# Patient Record
Sex: Female | Born: 1995 | Race: Black or African American | Hispanic: No | Marital: Single | Smoking: Current every day smoker
Health system: Southern US, Community
[De-identification: ages and names within clinical notes are randomized; demographics above are authoritative.]

## PROBLEM LIST (undated history)

## (undated) DIAGNOSIS — I10 Essential (primary) hypertension: Secondary | ICD-10-CM

## (undated) DIAGNOSIS — F99 Mental disorder, not otherwise specified: Secondary | ICD-10-CM

## (undated) DIAGNOSIS — K219 Gastro-esophageal reflux disease without esophagitis: Secondary | ICD-10-CM

## (undated) DIAGNOSIS — D649 Anemia, unspecified: Secondary | ICD-10-CM

## (undated) DIAGNOSIS — F32A Depression, unspecified: Secondary | ICD-10-CM

## (undated) DIAGNOSIS — Z789 Other specified health status: Secondary | ICD-10-CM

## (undated) DIAGNOSIS — B951 Streptococcus, group B, as the cause of diseases classified elsewhere: Secondary | ICD-10-CM

## (undated) DIAGNOSIS — F419 Anxiety disorder, unspecified: Secondary | ICD-10-CM

## (undated) DIAGNOSIS — T7840XA Allergy, unspecified, initial encounter: Secondary | ICD-10-CM

## (undated) HISTORY — DX: Mental disorder, not otherwise specified: F99

## (undated) HISTORY — DX: Depression, unspecified: F32.A

## (undated) HISTORY — DX: Gastro-esophageal reflux disease without esophagitis: K21.9

## (undated) HISTORY — DX: Essential (primary) hypertension: I10

## (undated) HISTORY — DX: Anemia, unspecified: D64.9

## (undated) HISTORY — DX: Allergy, unspecified, initial encounter: T78.40XA

## (undated) HISTORY — DX: Anxiety disorder, unspecified: F41.9

## (undated) HISTORY — PX: NO PAST SURGERIES: SHX2092

---
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
## 2014-08-18 ENCOUNTER — Emergency Department: Payer: Self-pay | Admitting: Emergency Medicine

## 2015-09-27 DIAGNOSIS — Z7251 High risk heterosexual behavior: Secondary | ICD-10-CM | POA: Diagnosis not present

## 2015-09-27 DIAGNOSIS — R112 Nausea with vomiting, unspecified: Secondary | ICD-10-CM | POA: Diagnosis not present

## 2015-09-27 DIAGNOSIS — Z113 Encounter for screening for infections with a predominantly sexual mode of transmission: Secondary | ICD-10-CM | POA: Diagnosis not present

## 2015-09-27 DIAGNOSIS — R634 Abnormal weight loss: Secondary | ICD-10-CM | POA: Diagnosis not present

## 2015-10-20 DIAGNOSIS — D509 Iron deficiency anemia, unspecified: Secondary | ICD-10-CM | POA: Diagnosis not present

## 2015-10-20 DIAGNOSIS — K219 Gastro-esophageal reflux disease without esophagitis: Secondary | ICD-10-CM | POA: Diagnosis not present

## 2015-10-20 DIAGNOSIS — R1084 Generalized abdominal pain: Secondary | ICD-10-CM | POA: Diagnosis not present

## 2015-10-20 DIAGNOSIS — R634 Abnormal weight loss: Secondary | ICD-10-CM | POA: Diagnosis not present

## 2016-05-09 LAB — HM PAP SMEAR: HM Pap smear: NEGATIVE

## 2016-09-28 ENCOUNTER — Emergency Department
Admission: EM | Admit: 2016-09-28 | Discharge: 2016-09-28 | Disposition: A | Discharge status: Home / Self Care | Payer: BLUE CROSS/BLUE SHIELD | Attending: Emergency Medicine | Admitting: Emergency Medicine

## 2016-09-28 DIAGNOSIS — J029 Acute pharyngitis, unspecified: Secondary | ICD-10-CM | POA: Diagnosis present

## 2016-09-28 DIAGNOSIS — B349 Viral infection, unspecified: Secondary | ICD-10-CM | POA: Diagnosis not present

## 2016-09-28 NOTE — Discharge Instructions (Signed)
You have been seen in the Emergency Department (ED) today for a likely viral illness.  Please drink plenty of clear fluids (water, Gatorade, chicken broth, etc).  You may use Tylenol and/or Motrin according to label instructions.  You can alternate between the two without any side effects.   As we discussed, it is POSSIBLE (though not certain) that you may have mononucleosis.  You declined testing tonight, but please read through the included information.  Just to be safe, we recommend you avoid contact sports or other circumstances where you may sustain an injury to your abdomen, because mononucleosis can cause swelling of your spleen which can rupture if you receive a traumatic injury to your abdomen.  Please follow up with your doctor as listed above.  Call your doctor or return to the Emergency Department (ED) if you are unable to tolerate fluids due to vomiting, have worsening trouble breathing, become extremely tired or difficult to awaken, or if you develop any other symptoms that concern you.

## 2016-09-28 NOTE — ED Provider Notes (Signed)
[facility] Emergency Department Provider Note  ____________________________________________   First MD Initiated Contact with Patient 09/28/16 0411     (approximate)  I have reviewed the triage vital signs and the nursing notes.   HISTORY  Chief Complaint Generalized Body Aches    HPI Katrina Ortega is a 21 y.o. female who is generally a healthy college student who is up-to-date on her vaccinations who presents by private vehicle in the company of her mother for evaluation of gradual onset generalized body aches, sore throat, and swollen lymph nodes on her neck as well as mild headache over the last 3 days.  She states it started as a sore throat which is persistent.  The rest of the symptoms developed gradually.  She denies fever/chills, chest pain, shortness of breath, cough, nausea, vomiting, abdominal pain, dysuria.  She does not have any difficulty swallowing or speaking but states that the sore throat is moderate.  She has some mild pain when she turns her head from side to side that she thinks is the result of the "swollen lumps" on her neck.  She has been taking tea that is supposed to be for the flu which includes acetaminophen but she has not taken any ibuprofen.  The tea makes her little bit better, nothing in particular makes her feel worse.   No past medical history on file.  There are no active problems to display for this patient.   No past surgical history on file.  Prior to Admission medications   Not on File    Allergies Percocet [oxycodone-acetaminophen]  No family history on file.  Social History Social History  Substance Use Topics  . Smoking status: Not on file  . Smokeless tobacco: Not on file  . Alcohol use Not on file    Review of Systems Constitutional: No fever/chills, +myalgias Eyes: No visual changes. ENT: +sore throat, +nasal congestion, swollen lymph nodes in neck Cardiovascular: Denies chest  pain. Respiratory: Denies shortness of breath. Gastrointestinal: No abdominal pain.  No nausea, no vomiting.  No diarrhea.  No constipation. Genitourinary: Negative for dysuria. Musculoskeletal: Negative for back pain. Skin: Negative for rash. Neurological: Negative for headaches, focal weakness or numbness.  10-point ROS otherwise negative.  ____________________________________________   PHYSICAL EXAM:  VITAL SIGNS: ED Triage Vitals  Enc Vitals Group     BP 09/28/16 0319 114/71     Pulse Rate 09/28/16 0319 61     Resp 09/28/16 0319 18     Temp 09/28/16 0319 98.9 F (37.2 C)     Temp Source 09/28/16 0319 Oral     SpO2 09/28/16 0319 96 %     Weight 09/28/16 0310 115 lb (52.2 kg)     Height 09/28/16 0310  (1.702 m)     Head Circumference --      Peak Flow --      Pain Score 09/28/16 0311 9     Pain Loc --      Pain Edu? --      Excl. in GC? --     Constitutional: Alert and oriented. Well appearing and in no acute distress. Eyes: Conjunctivae are normal. PERRL. EOMI. Head: Atraumatic. Nose: No congestion/rhinnorhea. Mouth/Throat: Mucous membranes are moist.  Oropharynx mildly erythematous with no exudate nor petechiae Neck: No stridor.  Bilateral cervical lymphadenopathy most notable posteriorly.  No meningeal signs, able to flex/extend and rotate head and neck without difficulty, only slight pain with the cervical lymphadenopthay.   Cardiovascular: Normal  rate, regular rhythm. Good peripheral circulation. Grossly normal heart sounds. Respiratory: Normal respiratory effort.  No retractions. Lungs CTAB. Gastrointestinal: Soft and nontender. No distention.  Musculoskeletal: No lower extremity tenderness nor edema. No gross deformities of extremities. Neurologic:  Normal speech and language. No gross focal neurologic deficits are appreciated.  Skin:  Skin is warm, dry and intact. No rash noted. Psychiatric: Mood and affect are normal. Speech and behavior are  normal.  ____________________________________________   LABS (all labs ordered are listed, but only abnormal results are displayed)  Labs Reviewed - No data to display ____________________________________________  EKG  None - EKG not ordered by ED physician ____________________________________________  RADIOLOGY   No results found.  ____________________________________________   PROCEDURES  Procedure(s) performed:   Procedures   Critical Care performed: No ____________________________________________   INITIAL IMPRESSION / ASSESSMENT AND PLAN / ED COURSE  Pertinent labs & imaging results that were available during my care of the patient were reviewed by me and considered in my medical decision making (see chart for details).  Signs and symptoms are consistent with viral illness.  Afebrile, vital signs are normal.  No indication for strep test.  I recommended getting a Monospot but the patient and her mother declined.  I gave my usual and customary management recommendations and return precautions.      ____________________________________________  FINAL CLINICAL IMPRESSION(S) / ED DIAGNOSES  Final diagnoses:  Viral syndrome     MEDICATIONS GIVEN DURING THIS VISIT:  Medications - No data to display   NEW OUTPATIENT MEDICATIONS STARTED DURING THIS VISIT:  New Prescriptions   No medications on file    Modified Medications   No medications on file    Discontinued Medications   No medications on file     Note:  This document was prepared using Dragon voice recognition software and may include unintentional dictation errors.    Loleta Rose, MD 09/28/16 [phone]

## 2016-09-28 NOTE — ED Triage Notes (Signed)
Patient reports generalized body aches for the past 2 days.

## 2017-09-24 ENCOUNTER — Ambulatory Visit: Payer: Self-pay | Admitting: Maternal Newborn

## 2017-09-29 ENCOUNTER — Ambulatory Visit: Payer: Self-pay | Admitting: Obstetrics and Gynecology

## 2018-07-09 LAB — HM HIV SCREENING LAB: HM HIV Screening: NEGATIVE

## 2018-07-30 ENCOUNTER — Encounter: Payer: Self-pay | Admitting: Obstetrics and Gynecology

## 2018-08-03 ENCOUNTER — Telehealth: Payer: Self-pay | Admitting: Radiology

## 2018-08-03 ENCOUNTER — Ambulatory Visit (INDEPENDENT_AMBULATORY_CARE_PROVIDER_SITE_OTHER): Payer: BLUE CROSS/BLUE SHIELD | Admitting: Obstetrics and Gynecology

## 2018-08-03 ENCOUNTER — Other Ambulatory Visit (INDEPENDENT_AMBULATORY_CARE_PROVIDER_SITE_OTHER): Payer: Medicaid Other

## 2018-08-03 DIAGNOSIS — Z3201 Encounter for pregnancy test, result positive: Secondary | ICD-10-CM

## 2018-08-03 NOTE — Progress Notes (Signed)
Patient presented to the office today for pregnancy test. She was  originally down for a new ob but insurance could not be verified and patient did not bring her insurance card. Urine was collected and pregnancy test was positive with an  EDD of  01/24/2018 according to last period. Patient is currently not taking any medication at this time. She will follow up with a new ob appointment in the next week or so.

## 2018-08-03 NOTE — Telephone Encounter (Signed)
Left message for patient to call [facility], per Dr Vergie Living need medical records from [facility] before seeing patient in office. I have called and left voicemail @ [facility] [phone]) requesting records to be faxed, provided information.

## 2018-08-06 ENCOUNTER — Encounter: Payer: Self-pay | Admitting: Obstetrics and Gynecology

## 2018-08-11 NOTE — Progress Notes (Signed)
Patient cancelled appointment.

## 2018-08-13 ENCOUNTER — Encounter: Payer: Self-pay | Admitting: Radiology

## 2018-08-19 ENCOUNTER — Encounter: Payer: Self-pay | Admitting: Advanced Practice Midwife

## 2018-08-19 ENCOUNTER — Other Ambulatory Visit (HOSPITAL_COMMUNITY)
Admission: RE | Admit: 2018-08-19 | Discharge: 2018-08-19 | Disposition: A | Discharge status: Home / Self Care | Payer: Medicaid Other | Source: Ambulatory Visit | Attending: Obstetrics and Gynecology | Admitting: Obstetrics and Gynecology

## 2018-08-19 ENCOUNTER — Ambulatory Visit (INDEPENDENT_AMBULATORY_CARE_PROVIDER_SITE_OTHER): Payer: Medicaid Other | Admitting: Advanced Practice Midwife

## 2018-08-19 DIAGNOSIS — F32A Depression, unspecified: Secondary | ICD-10-CM | POA: Insufficient documentation

## 2018-08-19 DIAGNOSIS — F329 Major depressive disorder, single episode, unspecified: Secondary | ICD-10-CM

## 2018-08-19 DIAGNOSIS — O99340 Other mental disorders complicating pregnancy, unspecified trimester: Secondary | ICD-10-CM

## 2018-08-19 DIAGNOSIS — Z3482 Encounter for supervision of other normal pregnancy, second trimester: Secondary | ICD-10-CM

## 2018-08-19 DIAGNOSIS — Z3480 Encounter for supervision of other normal pregnancy, unspecified trimester: Secondary | ICD-10-CM | POA: Diagnosis not present

## 2018-08-19 DIAGNOSIS — O99342 Other mental disorders complicating pregnancy, second trimester: Secondary | ICD-10-CM

## 2018-08-19 HISTORY — DX: Encounter for supervision of other normal pregnancy, unspecified trimester: Z34.80

## 2018-08-19 MED ORDER — ASPIRIN EC 81 MG PO TBEC: 81.0000 mg | DELAYED_RELEASE_TABLET | Freq: Every day | ORAL | 2 refills | Status: DC

## 2018-08-19 NOTE — Progress Notes (Signed)
PRENATAL VISIT NOTE  Subjective:  Katrina Ortega is a 23 y.o. G1P0000 at 4450w2d being seen today for ongoing prenatal care.  She is currently monitored for the following issues for this low-risk pregnancy and has Supervision of other normal pregnancy, antepartum and Depression on their problem list.  Patient reports no bleeding, no contractions, no cramping and no leaking.  She feels occasional muscle cramps in her arms which she relieves with gentle stretching, rest and Tylenol.  Movement: Present. Denies leaking of fluid.   The following portions of the patient's history were reviewed and updated as appropriate: allergies, current medications, past family history, past medical history, past social history, past surgical history and problem list. Problem list updated.  Objective:   Vitals:   08/19/18 1607  BP: 107/82  Pulse: 72  Weight: 67.1 kg    Fetal Status: Fetal Heart Rate (bpm): 156   Movement: Present     General:  Alert, oriented and cooperative. Patient is in no acute distress.  Skin: Skin is warm and dry. No rash noted.   Cardiovascular: Normal heart rate noted  Respiratory: Normal respiratory effort, no problems with respiration noted  Abdomen: Soft, gravid, appropriate for gestational age.  Pain/Pressure: Absent     Pelvic: Cervical exam deferred        Extremities: Normal range of motion.     Mental Status: Normal mood and affect. Normal behavior. Normal judgment and thought content.   Indications for ASA therapy (per uptodate) Two or more of the following: Nulliparity Yes Obesity (body mass index >30 kg/m2) No Family history of preeclampsia in mother or sister No Age ?35 years No Sociodemographic characteristics (African American race, low socioeconomic level) Yes Personal risk factors (eg, previous pregnancy with low birth weight or small for gestational age infant, previous adverse pregnancy outcome [eg, stillbirth], interval >10 years between pregnancies)  No  Indications for early 1 hour GTT (per uptodate)  BMI >25 (>23 in Asian women) AND one of the following  Gestational diabetes mellitus in a previous pregnancy No Glycated hemoglobin ?5.7 percent (39 mmol/mol), impaired glucose tolerance, or impaired fasting glucose on previous testing No First-degree relative with diabetes No High-risk race/ethnicity (eg, African American, Latino, Native American, [place] American, Pacific Islander) Yes History of cardiovascular disease No Hypertension or on therapy for hypertension No High-density lipoprotein cholesterol level <35 mg/dL (1.610.90 mmol/L) and/or a triglyceride level >250 mg/dL (0.962.82 mmol/L) No Polycystic ovary syndrome No Physical inactivity Yes Other clinical condition associated with insulin resistance (eg, severe obesity, acanthosis nigricans) No Previous birth of an infant weighing ?4000 g No Previous stillbirth of unknown cause No  Assessment and Plan:  Pregnancy: G1P0000 at 10150w2d  1. Supervision of other normal pregnancy, antepartum - No pregnancy-related complaints, continue routine care - Start daily Aspirin 81mg  based on above risk factors. Rx to patient pharmacy - Obstetric Panel, Including HIV - Hemoglobinopathy evaluation - Cytology - PAP( [place]) - Culture, OB Urine - US MFM OB COMP + 14 WK; Future - Comprehensive metabolic panel  2. Depression during pregnancy, antepartum - Plan two week postpartum mood check - [facility]  Preterm labor symptoms and general obstetric precautions including but not limited to vaginal bleeding, contractions, leaking of fluid and fetal movement were reviewed in detail with the patient. Please refer to After Visit Summary for other counseling recommendations.  Return in about 1 week (around 08/26/2018) for fasting 1 hour glucose tolerance test.  Future  Appointments  Date Time Provider Department Center  08/25/2018  1:30 PM  Tresea MallGledhill, Jane, CNM [facility] None  08/27/2018  8:45 AM [facility]  09/03/2018 10:45 AM [facility] US 2 [facility]    Calvert CantorSamantha C Natally Ribera, CNM

## 2018-08-19 NOTE — Progress Notes (Signed)
Taking prenatal vitamins Pap today

## 2018-08-19 NOTE — Patient Instructions (Signed)

## 2018-08-20 LAB — COMPREHENSIVE METABOLIC PANEL
ALT: 13 IU/L (ref 0–32)
AST: 13 IU/L (ref 0–40)
Albumin/Globulin Ratio: 1.6 (ref 1.2–2.2)
Albumin: 4 g/dL (ref 3.9–5.0)
Alkaline Phosphatase: 51 IU/L (ref 39–117)
BUN/Creatinine Ratio: 10 (ref 9–23)
BUN: 5 mg/dL — ABNORMAL LOW (ref 6–20)
Bilirubin Total: 0.2 mg/dL (ref 0.0–1.2)
CO2: 23 mmol/L (ref 20–29)
Calcium: 9.4 mg/dL (ref 8.7–10.2)
Chloride: 102 mmol/L (ref 96–106)
Creatinine, Ser: 0.5 mg/dL — ABNORMAL LOW (ref 0.57–1.00)
GFR calc non Af Amer: 137 mL/min/{1.73_m2} (ref >59)
GFR, EST AFRICAN AMERICAN: 158 mL/min/{1.73_m2} (ref >59)
GLOBULIN, TOTAL: 2.5 g/dL (ref 1.5–4.5)
Glucose: 77 mg/dL (ref 65–99)
Potassium: 3.7 mmol/L (ref 3.5–5.2)
Sodium: 138 mmol/L (ref 134–144)
Total Protein: 6.5 g/dL (ref 6.0–8.5)

## 2018-08-24 ENCOUNTER — Other Ambulatory Visit: Payer: Self-pay | Admitting: Advanced Practice Midwife

## 2018-08-24 ENCOUNTER — Encounter: Payer: Self-pay | Admitting: Family Medicine

## 2018-08-24 ENCOUNTER — Encounter: Payer: Self-pay | Admitting: Advanced Practice Midwife

## 2018-08-24 DIAGNOSIS — B951 Streptococcus, group B, as the cause of diseases classified elsewhere: Secondary | ICD-10-CM | POA: Insufficient documentation

## 2018-08-24 HISTORY — DX: Streptococcus, group b, as the cause of diseases classified elsewhere: B95.1

## 2018-08-24 LAB — HEMOGLOBINOPATHY EVALUATION
HGB C: 0 %
HGB S: 0 %
HGB VARIANT: 0 %
Hemoglobin A2 Quantitation: 2.5 % (ref 1.8–3.2)
Hemoglobin F Quantitation: 0 % (ref 0.0–2.0)
Hgb A: 97.5 % (ref 96.4–98.8)

## 2018-08-24 LAB — OBSTETRIC PANEL, INCLUDING HIV
Antibody Screen: NEGATIVE
BASOS: 1 %
Basophils Absolute: 0 10*3/uL (ref 0.0–0.2)
EOS (ABSOLUTE): 0 10*3/uL (ref 0.0–0.4)
Eos: 1 %
HIV Screen 4th Generation wRfx: NONREACTIVE
Hematocrit: 31.3 % — ABNORMAL LOW (ref 34.0–46.6)
Hemoglobin: 10.8 g/dL — ABNORMAL LOW (ref 11.1–15.9)
Hepatitis B Surface Ag: NEGATIVE
Immature Grans (Abs): 0 10*3/uL (ref 0.0–0.1)
Immature Granulocytes: 0 %
Lymphocytes Absolute: 1.5 10*3/uL (ref 0.7–3.1)
Lymphs: 20 %
MCH: 27.6 pg (ref 26.6–33.0)
MCHC: 34.5 g/dL (ref 31.5–35.7)
MCV: 80 fL (ref 79–97)
Monocytes Absolute: 0.7 10*3/uL (ref 0.1–0.9)
Monocytes: 9 %
Neutrophils Absolute: 5 10*3/uL (ref 1.4–7.0)
Neutrophils: 69 %
Platelets: 238 10*3/uL (ref 150–450)
RBC: 3.92 x10E6/uL (ref 3.77–5.28)
RDW: 17 % — ABNORMAL HIGH (ref 11.7–15.4)
RPR Ser Ql: NONREACTIVE
Rh Factor: POSITIVE
Rubella Antibodies, IGG: 3.68 index (ref >0.99)
WBC: 7.2 10*3/uL (ref 3.4–10.8)

## 2018-08-24 LAB — URINE CULTURE, OB REFLEX

## 2018-08-24 LAB — CULTURE, OB URINE

## 2018-08-24 NOTE — Progress Notes (Signed)
GBS bacteruria. Clinic messaged to send Amoxicillin 500mg  TID for 7 days and to notify patient. Problem list updated.  Clayton Bibles, CNM 08/24/18 4:42 PM

## 2018-08-25 ENCOUNTER — Encounter: Payer: Self-pay | Admitting: Advanced Practice Midwife

## 2018-08-25 ENCOUNTER — Other Ambulatory Visit: Payer: Self-pay | Admitting: *Deleted

## 2018-08-25 LAB — CYTOLOGY - PAP
Candida vaginitis: POSITIVE — AB
Chlamydia: NEGATIVE
HPV 16/18/45 genotyping: NEGATIVE
HPV: DETECTED — AB
Neisseria Gonorrhea: NEGATIVE
TRICH (WINDOWPATH): NEGATIVE

## 2018-08-25 MED ORDER — AMOXICILLIN 500 MG PO CAPS: 500.0000 mg | ORAL_CAPSULE | Freq: Three times a day (TID) | ORAL | 0 refills | Status: DC

## 2018-08-25 NOTE — Telephone Encounter (Signed)
Called patient to inform her of GBS positive in urine and we will send in antibiotic to take.   Scheryl Marten, RN

## 2018-08-27 ENCOUNTER — Encounter (HOSPITAL_COMMUNITY): Payer: Self-pay

## 2018-08-27 ENCOUNTER — Other Ambulatory Visit: Payer: Medicaid Other

## 2018-08-27 DIAGNOSIS — Z3480 Encounter for supervision of other normal pregnancy, unspecified trimester: Secondary | ICD-10-CM

## 2018-08-28 LAB — GLUCOSE TOLERANCE, 1 HOUR: Glucose, 1Hr PP: 89 mg/dL (ref 65–199)

## 2018-09-03 ENCOUNTER — Other Ambulatory Visit (HOSPITAL_COMMUNITY): Payer: Self-pay | Admitting: *Deleted

## 2018-09-03 ENCOUNTER — Ambulatory Visit (HOSPITAL_COMMUNITY)
Admission: RE | Admit: 2018-09-03 | Discharge: 2018-09-03 | Disposition: A | Discharge status: Home / Self Care | Payer: Medicaid Other | Source: Ambulatory Visit | Attending: Advanced Practice Midwife | Admitting: Advanced Practice Midwife

## 2018-09-03 DIAGNOSIS — Z362 Encounter for other antenatal screening follow-up: Secondary | ICD-10-CM

## 2018-09-03 DIAGNOSIS — Z3480 Encounter for supervision of other normal pregnancy, unspecified trimester: Secondary | ICD-10-CM | POA: Diagnosis not present

## 2018-09-03 DIAGNOSIS — Z3A19 19 weeks gestation of pregnancy: Secondary | ICD-10-CM | POA: Diagnosis not present

## 2018-09-03 DIAGNOSIS — Z363 Encounter for antenatal screening for malformations: Secondary | ICD-10-CM

## 2018-09-16 ENCOUNTER — Ambulatory Visit (INDEPENDENT_AMBULATORY_CARE_PROVIDER_SITE_OTHER): Payer: Medicaid Other | Admitting: Advanced Practice Midwife

## 2018-09-16 VITALS — BP 103/63 | HR 71 | Wt 150.0 lb

## 2018-09-16 DIAGNOSIS — B977 Papillomavirus as the cause of diseases classified elsewhere: Secondary | ICD-10-CM | POA: Insufficient documentation

## 2018-09-16 DIAGNOSIS — R87619 Unspecified abnormal cytological findings in specimens from cervix uteri: Secondary | ICD-10-CM | POA: Insufficient documentation

## 2018-09-16 DIAGNOSIS — Z3480 Encounter for supervision of other normal pregnancy, unspecified trimester: Secondary | ICD-10-CM

## 2018-09-16 DIAGNOSIS — R87612 Low grade squamous intraepithelial lesion on cytologic smear of cervix (LGSIL): Secondary | ICD-10-CM

## 2018-09-16 DIAGNOSIS — Z3482 Encounter for supervision of other normal pregnancy, second trimester: Secondary | ICD-10-CM

## 2018-09-16 DIAGNOSIS — Z3A21 21 weeks gestation of pregnancy: Secondary | ICD-10-CM

## 2018-09-16 MED ORDER — TERCONAZOLE 0.4 % VA CREA: 1.0000 | TOPICAL_CREAM | Freq: Every day | VAGINAL | 0 refills | Status: DC

## 2018-09-16 MED ORDER — AMOXICILLIN 500 MG PO CAPS: 500.0000 mg | ORAL_CAPSULE | Freq: Three times a day (TID) | ORAL | 0 refills | Status: DC

## 2018-09-16 NOTE — Progress Notes (Signed)
   PRENATAL VISIT NOTE  Subjective:  Katrina Ortega is a 23 y.o. G1P0000 at [redacted]w[redacted]d being seen today for ongoing prenatal care.  She is currently monitored for the following issues for this low-risk pregnancy and has Supervision of other normal pregnancy, antepartum; Depression; Group beta Strep positive; Abnormal Pap smear of cervix; and HPV (human papilloma virus) infection on their problem list.  Patient reports that she has not taken Amoxicillin as previously prescribed. She states she was told the medicine "was for GBS not a UTI". She estimates she has ten pills remaining of the original prescription. She endorses cramping while voiding.  Patient believes she has a yeast infection. She previously declined medication and planned to manage it with OTC Monistat. She is requesting a prescription for vaginal cream today.. Movement: Present. Denies leaking of fluid.   The following portions of the patient's history were reviewed and updated as appropriate: allergies, current medications, past family history, past medical history, past social history, past surgical history and problem list. Problem list updated.  Objective:   Vitals:   09/16/18 1417  BP: 103/63  Pulse: 71  Weight: 150 lb (68 kg)    Fetal Status: Fetal Heart Rate (bpm): 155   Movement: Present     General:  Alert, oriented and cooperative. Patient is in no acute distress.  Skin: Skin is warm and dry. No rash noted.   Cardiovascular: Normal heart rate noted  Respiratory: Normal respiratory effort, no problems with respiration noted  Abdomen: Soft, gravid, appropriate for gestational age.  Pain/Pressure: Absent     Pelvic: Cervical exam deferred        Extremities: Normal range of motion.     Mental Status: Normal mood and affect. Normal behavior. Normal judgment and thought content.   Assessment and Plan:  Pregnancy: G1P0000 at [redacted]w[redacted]d  1. Supervision of other normal pregnancy, antepartum --Patient to restart daily  Aspirin 81mg , Amoxicillin for UTI and Terconazole for previously diagnosed UTI and yeast --Verbalizes desire for waterbirth. Reviewed criteria, reiterated in AVS --Reviewed all results from labs collected at [facility] 08/19/18  --Discussed move to new building this weekend, confirmed new location of MAU for emergencies  2. Low grade squamous intraepithelial lesion on cytologic smear of cervix (LGSIL) --Needs postpartum colposcopy per ASCCP guidelines  Preterm labor symptoms and general obstetric precautions including but not limited to vaginal bleeding, contractions, leaking of fluid and fetal movement were reviewed in detail with the patient. Please refer to After Visit Summary for other counseling recommendations.  Return in about 4 weeks (around 10/14/2018).  Future Appointments  Date Time Provider Department Center  10/02/2018 10:45 AM [facility] [place] 2 [facility]  10/14/2018  2:00 PM Calvert Cantor, CNM [facility]    Calvert Cantor, [place]

## 2018-09-16 NOTE — Patient Instructions (Addendum)
Second Trimester of Pregnancy  The second trimester is from week 14 through week 27 (month 4 through 6). This is often the time in pregnancy that you feel your best. Often times, morning sickness has lessened or quit. You may have more energy, and you may get hungry more often. Your unborn baby is growing rapidly. At the end of the sixth month, he or she is about 9 inches long and weighs about 1 pounds. You will likely feel the baby move between 18 and 20 weeks of pregnancy. Follow these instructions at home: Medicines  Take over-the-counter and prescription medicines only as told by your doctor. Some medicines are safe and some medicines are not safe during pregnancy.  Take a prenatal vitamin that contains at least 600 micrograms (mcg) of folic acid.  If you have trouble pooping (constipation), take medicine that will make your stool soft (stool softener) if your doctor approves. Eating and drinking   Eat regular, healthy meals.  Avoid raw meat and uncooked cheese.  If you get low calcium from the food you eat, talk to your doctor about taking a daily calcium supplement.  Avoid foods that are high in fat and sugars, such as fried and sweet foods.  If you feel sick to your stomach (nauseous) or throw up (vomit): ? Eat 4 or 5 small meals a day instead of 3 large meals. ? Try eating a few soda crackers. ? Drink liquids between meals instead of during meals.  To prevent constipation: ? Eat foods that are high in fiber, like fresh fruits and vegetables, whole grains, and beans. ? Drink enough fluids to keep your pee (urine) clear or pale yellow. Activity  Exercise only as told by your doctor. Stop exercising if you start to have cramps.  Do not exercise if it is too hot, too humid, or if you are in a place of great height (high altitude).  Avoid heavy lifting.  Wear low-heeled shoes. Sit and stand up straight.  You can continue to have sex unless your doctor tells you not  to. Relieving pain and discomfort  Wear a good support bra if your breasts are tender.  Take warm water baths (sitz baths) to soothe pain or discomfort caused by hemorrhoids. Use hemorrhoid cream if your doctor approves.  Rest with your legs raised if you have leg cramps or low back pain.  If you develop puffy, bulging veins (varicose veins) in your legs: ? Wear support hose or compression stockings as told by your doctor. ? Raise (elevate) your feet for 15 minutes, 3-4 times a day. ? Limit salt in your food. Prenatal care  Write down your questions. Take them to your prenatal visits.  Keep all your prenatal visits as told by your doctor. This is important. Safety  Wear your seat belt when driving.  Make a list of emergency phone numbers, including numbers for family, friends, the hospital, and police and fire departments. General instructions  Ask your doctor about the right foods to eat or for help finding a counselor, if you need these services.  Ask your doctor about local prenatal classes. Begin classes before month 6 of your pregnancy.  Do not use hot tubs, steam rooms, or saunas.  Do not douche or use tampons or scented sanitary pads.  Do not cross your legs for long periods of time.  Visit your dentist if you have not done so. Use a soft toothbrush to brush your teeth. Floss gently.  Avoid all smoking, herbs,  and alcohol. Avoid drugs that are not approved by your doctor.  Do not use any products that contain nicotine or tobacco, such as cigarettes and e-cigarettes. If you need help quitting, ask your doctor.  Avoid cat litter boxes and soil used by cats. These carry germs that can cause birth defects in the baby and can cause a loss of your baby (miscarriage) or stillbirth. Contact a doctor if:  You have mild cramps or pressure in your lower belly.  You have pain when you pee (urinate).  You have bad smelling fluid coming from your vagina.  You continue to  feel sick to your stomach (nauseous), throw up (vomit), or have watery poop (diarrhea).  You have a nagging pain in your belly area.  You feel dizzy. Get help right away if:  You have a fever.  You are leaking fluid from your vagina.  You have spotting or bleeding from your vagina.  You have severe belly cramping or pain.  You lose or gain weight rapidly.  You have trouble catching your breath and have chest pain.  You notice sudden or extreme puffiness (swelling) of your face, hands, ankles, feet, or legs.  You have not felt the baby move in over an hour.  You have severe headaches that do not go away when you take medicine.  You have trouble seeing. Summary  The second trimester is from week 14 through week 27 (months 4 through 6). This is often the time in pregnancy that you feel your best.  To take care of yourself and your unborn baby, you will need to eat healthy meals, take medicines only if your doctor tells you to do so, and do activities that are safe for you and your baby.  Call your doctor if you get sick or if you notice anything unusual about your pregnancy. Also, call your doctor if you need help with the right food to eat, or if you want to know what activities are safe for you. This information is not intended to replace advice given to you by your health care provider. Make sure you discuss any questions you have with your health care provider. Document Released: 10/09/2009 Document Revised: 08/20/2016 Document Reviewed: 08/20/2016 Elsevier Interactive Patient Education  2019 [place]? Guide for patients at [facility] [employer]  Why consider waterbirth?  . Gentle birth for babies . Less pain medicine used in labor . May allow for passive descent/less pushing . May reduce perineal tears  . More mobility and instinctive maternal position changes . Increased maternal relaxation . Reduced blood pressure in  labor  Is waterbirth safe? What are the risks of infection, drowning or other complications?  . Infection: o Very low risk (3.7 % for tub vs 4.8% for bed) o 7 in 8000 waterbirths with documented infection o Poorly cleaned equipment most common cause o Slightly lower group B strep transmission rate  . Drowning o Maternal:  - Very low risk   - Related to seizures or fainting o Newborn:  - Very low risk. No evidence of increased risk of respiratory problems in multiple large studies - Physiological protection from breathing under water - Avoid underwater birth if there are any fetal complications - Once baby's head is out of the water, keep it out.  . Birth complication o Some reports of cord trauma, but risk decreased by bringing baby to surface gradually o No evidence of increased risk of shoulder dystocia. Mothers can usually change positions  faster in water than in a bed, possibly aiding the maneuvers to free the shoulder.   You must attend a Doren Custard class at [facility]  3rd Wednesday of every month from 7-9pm  [employer] by calling [phone] or online at [email or web address]  Bring [place] the certificate from the class to your prenatal appointment  Meet with a midwife at 36 weeks to see if you can still plan a waterbirth and to sign the consent.   If you plan a waterbirth after September 20, 2018, at [facility], you will need to purchase the following:  Fish net  Bathing suit top (optional)  Long-handled mirror (optional)  If you plan a waterbirth before September 20, 2018: Purchase or rent the following supplies: You are responsible for providing all supplies listed above. **If you do not have all necessary supplies you cannot have a waterbirth.**   Water Birth Pool (Birth Pool in a Box or [place] for instance)  (Tubs start ~$125)  Single-use disposable tub liner designed for your brand of tub  Electric drain  pump to remove water (We recommend 792 gallon per hour or greater pump.)   New garden hose labeled "lead-free", "suitable for drinking water",  Separate garden hose to remove the dirty water  Fish net  Bathing suit top (optional)  Long-handled mirror (optional)  Places to purchase or rent supplies:   [employer] for tub purchases and supplies  [employer] for tub purchases and supplies  [employer] (www.thelaborladies.com) $275 for tub rental/set-up & take down/kit   [employer] (http://www.fleming.com/.htm) Information regarding doulas (labor support) who provide pool rentals  Things that would prevent you from having a waterbirth:  Premature, <37wks  Previous cesarean birth  Presence of thick meconium-stained fluid  Multiple gestation (Twins, triplets, etc.)  Uncontrolled diabetes or gestational diabetes requiring medication  Hypertension requiring medication or diagnosis of pre-eclampsia  Heavy vaginal bleeding  Non-reassuring fetal heart rate  Active infection (MRSA, etc.). Group B Strep is NOT a contraindication for waterbirth.  If your labor has to be induced and induction method requires continuous monitoring of the baby's heart rate  Other risks/issues identified by your obstetrical provider  Please remember that birth is unpredictable. Under certain unforeseeable circumstances your provider may advise against giving birth in the tub. These decisions will be made on a case-by-case basis and with the safety of you and your baby as our highest priority.

## 2018-10-02 ENCOUNTER — Ambulatory Visit (HOSPITAL_COMMUNITY): Payer: Medicaid Other

## 2018-10-06 ENCOUNTER — Ambulatory Visit (HOSPITAL_COMMUNITY)
Admission: RE | Admit: 2018-10-06 | Discharge: 2018-10-06 | Disposition: A | Discharge status: Home / Self Care | Payer: Medicaid Other | Source: Ambulatory Visit | Attending: Maternal & Fetal Medicine | Admitting: Maternal & Fetal Medicine

## 2018-10-06 DIAGNOSIS — Z362 Encounter for other antenatal screening follow-up: Secondary | ICD-10-CM | POA: Insufficient documentation

## 2018-10-06 DIAGNOSIS — Z3A24 24 weeks gestation of pregnancy: Secondary | ICD-10-CM

## 2018-10-14 ENCOUNTER — Other Ambulatory Visit: Payer: Self-pay

## 2018-10-14 ENCOUNTER — Ambulatory Visit (INDEPENDENT_AMBULATORY_CARE_PROVIDER_SITE_OTHER): Payer: Medicaid Other | Admitting: Advanced Practice Midwife

## 2018-10-14 VITALS — BP 112/78 | HR 72 | Wt 152.4 lb

## 2018-10-14 DIAGNOSIS — Z3482 Encounter for supervision of other normal pregnancy, second trimester: Secondary | ICD-10-CM

## 2018-10-14 DIAGNOSIS — Z3480 Encounter for supervision of other normal pregnancy, unspecified trimester: Secondary | ICD-10-CM

## 2018-10-14 NOTE — Progress Notes (Signed)
   PRENATAL VISIT NOTE  Subjective:  Katrina Ortega is a 23 y.o. G1P0000 at [redacted]w[redacted]d being seen today for ongoing prenatal care.  She is currently monitored for the following issues for this low-risk pregnancy and has Supervision of other normal pregnancy, antepartum; Depression; Group beta Strep positive; Abnormal Pap smear of cervix; and HPV (human papilloma virus) infection on their problem list.  Patient reports no complaints.  Contractions: Not present.  .  Movement: Present. Denies leaking of fluid.   The following portions of the patient's history were reviewed and updated as appropriate: allergies, current medications, past family history, past medical history, past social history, past surgical history and problem list. Problem list updated.  Objective:   Vitals:   10/14/18 1429  BP: 112/78  Pulse: 72  Weight: 152 lb 6.4 oz (69.1 kg)    Fetal Status: Fetal Heart Rate (bpm): 153 Fundal Height: 27 cm Movement: Present     General:  Alert, oriented and cooperative. Patient is in no acute distress.  Skin: Skin is warm and dry. No rash noted.   Cardiovascular: Normal heart rate noted  Respiratory: Normal respiratory effort, no problems with respiration noted  Abdomen: Soft, gravid, appropriate for gestational age.  Pain/Pressure: Absent     Pelvic: Cervical exam deferred        Extremities: Normal range of motion.     Mental Status: Normal mood and affect. Normal behavior. Normal judgment and thought content.   Assessment and Plan:  Pregnancy: G1P0000 at [redacted]w[redacted]d  1. Supervision of other normal pregnancy, antepartum - No complaints or concerns, routine care - Desires waterbirth. Reviewed milestones and criteria for inclusion. Reviewed possible upcoming changes to class requirement  Preterm labor symptoms and general obstetric precautions including but not limited to vaginal bleeding, contractions, leaking of fluid and fetal movement were reviewed in detail with the patient.  Please refer to After Visit Summary for other counseling recommendations.  Return in about 3 weeks (around 11/04/2018).  Future Appointments  Date Time Provider Department Center  11/04/2018  9:00 AM Calvert Cantor, CNM [facility]    Calvert Cantor, [place]

## 2018-10-14 NOTE — Patient Instructions (Signed)

## 2018-11-04 ENCOUNTER — Ambulatory Visit (INDEPENDENT_AMBULATORY_CARE_PROVIDER_SITE_OTHER): Payer: Medicaid Other | Admitting: Advanced Practice Midwife

## 2018-11-04 ENCOUNTER — Other Ambulatory Visit: Payer: Self-pay

## 2018-11-04 VITALS — BP 130/76 | HR 72 | Wt 158.0 lb

## 2018-11-04 DIAGNOSIS — Z3A28 28 weeks gestation of pregnancy: Secondary | ICD-10-CM

## 2018-11-04 DIAGNOSIS — Z3480 Encounter for supervision of other normal pregnancy, unspecified trimester: Secondary | ICD-10-CM

## 2018-11-04 DIAGNOSIS — Z23 Encounter for immunization: Secondary | ICD-10-CM

## 2018-11-04 DIAGNOSIS — J302 Other seasonal allergic rhinitis: Secondary | ICD-10-CM

## 2018-11-04 DIAGNOSIS — O2600 Excessive weight gain in pregnancy, unspecified trimester: Secondary | ICD-10-CM

## 2018-11-04 DIAGNOSIS — Z3483 Encounter for supervision of other normal pregnancy, third trimester: Secondary | ICD-10-CM

## 2018-11-04 DIAGNOSIS — O2603 Excessive weight gain in pregnancy, third trimester: Secondary | ICD-10-CM

## 2018-11-04 MED ORDER — CETIRIZINE HCL 10 MG PO TABS: 10.0000 mg | ORAL_TABLET | Freq: Every day | ORAL | Status: DC

## 2018-11-04 NOTE — Progress Notes (Signed)
-  tdap today

## 2018-11-04 NOTE — Progress Notes (Signed)
   PRENATAL VISIT NOTE  Subjective:  Katrina Ortega is a 23 y.o. G1P0000 at [redacted]w[redacted]d being seen today for ongoing prenatal care.  She is currently monitored for the following issues for this low-risk pregnancy and has Supervision of other normal pregnancy, antepartum; Depression; Group beta Strep positive; Abnormal Pap smear of cervix; and HPV (human papilloma virus) infection on their problem list.  Patient reports frequent sneezing due to seasonal allergies.  Contractions: Not present.  .  Movement: Present. Denies leaking of fluid.   The following portions of the patient's history were reviewed and updated as appropriate: allergies, current medications, past family history, past medical history, past social history, past surgical history and problem list. Problem list updated.  Objective:   Vitals:   11/04/18 0916  BP: 130/76  Pulse: 72  Weight: 158 lb (71.7 kg)    Fetal Status: Fetal Heart Rate (bpm): 154 Fundal Height: 29 cm Movement: Present     General:  Alert, oriented and cooperative. Patient is in no acute distress.  Skin: Skin is warm and dry. No rash noted.   Cardiovascular: Normal heart rate noted  Respiratory: Normal respiratory effort, no problems with respiration noted  Abdomen: Soft, gravid, appropriate for gestational age.  Pain/Pressure: Absent     Pelvic: Cervical exam deferred        Extremities: Normal range of motion.     Mental Status: Normal mood and affect. Normal behavior. Normal judgment and thought content.   Assessment and Plan:  Pregnancy: G1P0000 at [redacted]w[redacted]d  1. Supervision of other normal pregnancy, antepartum --No pregnancy-related complaints or concerns --Reviewed daily kick counts starting at 30 weeks, interventions for not meeting counts --Reviewed milestones for remaining visits --Discussed cancellation of [facility]births at [facility] - Glucose Tolerance, 2 Hours w/1 Hour - RPR - HIV Antibody (routine testing w rflx) - Enroll Patient in Babyscripts -  CBC  2. Abnormal weight gain in pregnancy - 6 lb weight gain from previous visit. Reviewed nutrition in pregnancy including fruits and vegetables, lean protein - FH appropriate  3. Seasonal allergies  - Rx Zyrtec to pharmacy  Preterm labor symptoms and general obstetric precautions including but not limited to vaginal bleeding, contractions, leaking of fluid and fetal movement were reviewed in detail with the patient. Please refer to After Visit Summary for other counseling recommendations.  Return in about 4 weeks (around 12/02/2018) for virtual visit.  Future Appointments  Date Time Provider Department Center  11/19/2018  1:30 PM Anyanwu, Jethro Bastos, MD [facility]    Calvert Cantor, [place]

## 2018-11-04 NOTE — Patient Instructions (Signed)

## 2018-11-05 LAB — HIV ANTIBODY (ROUTINE TESTING W REFLEX): HIV Screen 4th Generation wRfx: NONREACTIVE

## 2018-11-05 LAB — CBC
Hematocrit: 28.5 % — ABNORMAL LOW (ref 34.0–46.6)
Hemoglobin: 9.4 g/dL — ABNORMAL LOW (ref 11.1–15.9)
MCH: 29.2 pg (ref 26.6–33.0)
MCHC: 33 g/dL (ref 31.5–35.7)
MCV: 89 fL (ref 79–97)
Platelets: 207 10*3/uL (ref 150–450)
RBC: 3.22 x10E6/uL — ABNORMAL LOW (ref 3.77–5.28)
RDW: 13.1 % (ref 11.7–15.4)
WBC: 7.7 10*3/uL (ref 3.4–10.8)

## 2018-11-05 LAB — GLUCOSE TOLERANCE, 2 HOURS W/ 1HR
Glucose, 1 hour: 151 mg/dL (ref 65–179)
Glucose, 2 hour: 120 mg/dL (ref 65–152)
Glucose, Fasting: 84 mg/dL (ref 65–91)

## 2018-11-05 LAB — RPR: RPR Ser Ql: NONREACTIVE

## 2018-11-19 ENCOUNTER — Other Ambulatory Visit: Payer: Self-pay

## 2018-11-19 ENCOUNTER — Encounter: Payer: Self-pay | Admitting: Obstetrics & Gynecology

## 2018-11-19 ENCOUNTER — Ambulatory Visit (INDEPENDENT_AMBULATORY_CARE_PROVIDER_SITE_OTHER): Payer: Medicaid Other | Admitting: Obstetrics & Gynecology

## 2018-11-19 VITALS — BP 134/65 | HR 104 | Wt 159.0 lb

## 2018-11-19 DIAGNOSIS — Z3480 Encounter for supervision of other normal pregnancy, unspecified trimester: Secondary | ICD-10-CM

## 2018-11-19 DIAGNOSIS — Z3A30 30 weeks gestation of pregnancy: Secondary | ICD-10-CM

## 2018-11-19 DIAGNOSIS — Z3483 Encounter for supervision of other normal pregnancy, third trimester: Secondary | ICD-10-CM

## 2018-11-19 NOTE — Progress Notes (Signed)
   PRENATAL VISIT NOTE  Subjective:  Katrina Ortega is a 23 y.o. G1P0000 at [redacted]w[redacted]d being seen today for ongoing prenatal care.  She is currently monitored for the following issues for this low-risk pregnancy and has Supervision of other normal pregnancy, antepartum; Depression; Group beta Strep positive; Abnormal Pap smear of cervix; and HPV (human papilloma virus) infection on their problem list.  Patient reports no complaints.  Contractions: Irritability. Vag. Bleeding: None.  Movement: Present. Denies leaking of fluid.   The following portions of the patient's history were reviewed and updated as appropriate: allergies, current medications, past family history, past medical history, past social history, past surgical history and problem list.   Objective:   Vitals:   11/19/18 1334  BP: 134/65  Pulse: (!) 104  Weight: 159 lb (72.1 kg)    Fetal Status: Fetal Heart Rate (bpm): 162   Movement: Present     General:  Alert, oriented and cooperative. Patient is in no acute distress.  Skin: Skin is warm and dry. No rash noted.   Cardiovascular: Normal heart rate noted  Respiratory: Normal respiratory effort, no problems with respiration noted  Abdomen: Soft, gravid, appropriate for gestational age.  Pain/Pressure: Present     Pelvic: Cervical exam deferred        Extremities: Normal range of motion.  Edema: Trace  Mental Status: Normal mood and affect. Normal behavior. Normal judgment and thought content.   Assessment and Plan:  Pregnancy: G1P0000 at [redacted]w[redacted]d 1. Supervision of other normal pregnancy, antepartum Preterm labor symptoms and general obstetric precautions including but not limited to vaginal bleeding, contractions, leaking of fluid and fetal movement were reviewed in detail with the patient. Please refer to After Visit Summary for other counseling recommendations.   Return in about 3 weeks (around 12/10/2018) for Virtual OB Visit  6 weeks: Pelvic cultures, OFFICE OB Visit.   Future Appointments  Date Time Provider Department Center  12/02/2018  1:30 PM Calvert Cantor, CNM [facility]    Jaynie Collins, MD

## 2018-11-19 NOTE — Patient Instructions (Signed)
Return to office for any scheduled appointments. Call the office or go to the MAU at [facility] if:  You begin to have strong, frequent contractions  Your water breaks.  Sometimes it is a big gush of fluid, sometimes it is just a trickle that keeps getting your panties wet or running down your legs  You have vaginal bleeding.  It is normal to have a small amount of spotting if your cervix was checked.   You do not feel your baby moving like normal.  If you do not, get something to eat and drink and lay down and focus on feeling your baby move.   If your baby is still not moving like normal, you should call the office or go to MAU.  Any other obstetric concerns.   Third Trimester of Pregnancy The third trimester is from week 28 through week 40 (months 7 through 9). The third trimester is a time when the unborn baby (fetus) is growing rapidly. At the end of the ninth month, the fetus is about 20 inches in length and weighs 6-10 pounds. Body changes during your third trimester Your body will continue to go through many changes during pregnancy. The changes vary from woman to woman. During the third trimester:  Your weight will continue to increase. You can expect to gain 25-35 pounds (11-16 kg) by the end of the pregnancy.  You may begin to get stretch marks on your hips, abdomen, and breasts.  You may urinate more often because the fetus is moving lower into your pelvis and pressing on your bladder.  You may develop or continue to have heartburn. This is caused by increased hormones that slow down muscles in the digestive tract.  You may develop or continue to have constipation because increased hormones slow digestion and cause the muscles that push waste through your intestines to relax.  You may develop hemorrhoids. These are swollen veins (varicose veins) in the rectum that can itch or be painful.  You may develop swollen, bulging veins (varicose veins)  in your legs.  You may have increased body aches in the pelvis, back, or thighs. This is due to weight gain and increased hormones that are relaxing your joints.  You may have changes in your hair. These can include thickening of your hair, rapid growth, and changes in texture. Some women also have hair loss during or after pregnancy, or hair that feels dry or thin. Your hair will most likely return to normal after your baby is born.  Your breasts will continue to grow and they will continue to become tender. A yellow fluid (colostrum) may leak from your breasts. This is the first milk you are producing for your baby.  Your belly button may stick out.  You may notice more swelling in your hands, face, or ankles.  You may have increased tingling or numbness in your hands, arms, and legs. The skin on your belly may also feel numb.  You may feel short of breath because of your expanding uterus.  You may have more problems sleeping. This can be caused by the size of your belly, increased need to urinate, and an increase in your body's metabolism.  You may notice the fetus "dropping," or moving lower in your abdomen (lightening).  You may have increased vaginal discharge.  You may notice your joints feel loose and you may have pain around your pelvic bone. What to expect at prenatal visits You will have   prenatal exams every 2 weeks until week 36. Then you will have weekly prenatal exams. During a routine prenatal visit:  You will be weighed to make sure you and the baby are growing normally.  Your blood pressure will be taken.  Your abdomen will be measured to track your baby's growth.  The fetal heartbeat will be listened to.  Any test results from the previous visit will be discussed.  You may have a cervical check near your due date to see if your cervix has softened or thinned (effaced).  You will be tested for Group B streptococcus. This happens between 35 and 37 weeks. Your  health care provider may ask you:  What your birth plan is.  How you are feeling.  If you are feeling the baby move.  If you have had any abnormal symptoms, such as leaking fluid, bleeding, severe headaches, or abdominal cramping.  If you are using any tobacco products, including cigarettes, chewing tobacco, and electronic cigarettes.  If you have any questions. Other tests or screenings that may be performed during your third trimester include:  Blood tests that check for low iron levels (anemia).  Fetal testing to check the health, activity level, and growth of the fetus. Testing is done if you have certain medical conditions or if there are problems during the pregnancy.  Nonstress test (NST). This test checks the health of your baby to make sure there are no signs of problems, such as the baby not getting enough oxygen. During this test, a belt is placed around your belly. The baby is made to move, and its heart rate is monitored during movement. What is false labor? False labor is a condition in which you feel small, irregular tightenings of the muscles in the womb (contractions) that usually go away with rest, changing position, or drinking water. These are called Braxton Hicks contractions. Contractions may last for hours, days, or even weeks before true labor sets in. If contractions come at regular intervals, become more frequent, increase in intensity, or become painful, you should see your health care provider. What are the signs of labor?  Abdominal cramps.  Regular contractions that start at 10 minutes apart and become stronger and more frequent with time.  Contractions that start on the top of the uterus and spread down to the lower abdomen and back.  Increased pelvic pressure and dull back pain.  A watery or bloody mucus discharge that comes from the vagina.  Leaking of amniotic fluid. This is also known as your "water breaking." It could be a slow trickle or a gush.  Let your health care provider know if it has a color or strange odor. If you have any of these signs, call your health care provider right away, even if it is before your due date. Follow these instructions at home: Medicines  Follow your health care provider's instructions regarding medicine use. Specific medicines may be either safe or unsafe to take during pregnancy.  Take a prenatal vitamin that contains at least 600 micrograms (mcg) of folic acid.  If you develop constipation, try taking a stool softener if your health care provider approves. Eating and drinking   Eat a balanced diet that includes fresh fruits and vegetables, whole grains, good sources of protein such as meat, eggs, or tofu, and low-fat dairy. Your health care provider will help you determine the amount of weight gain that is right for you.  Avoid raw meat and uncooked cheese. These carry germs that   can cause birth defects in the baby.  If you have low calcium intake from food, talk to your health care provider about whether you should take a daily calcium supplement.  Eat four or five small meals rather than three large meals a day.  Limit foods that are high in fat and processed sugars, such as fried and sweet foods.  To prevent constipation: ? Drink enough fluid to keep your urine clear or pale yellow. ? Eat foods that are high in fiber, such as fresh fruits and vegetables, whole grains, and beans. Activity  Exercise only as directed by your health care provider. Most women can continue their usual exercise routine during pregnancy. Try to exercise for 30 minutes at least 5 days a week. Stop exercising if you experience uterine contractions.  Avoid heavy lifting.  Do not exercise in extreme heat or humidity, or at high altitudes.  Wear low-heel, comfortable shoes.  Practice good posture.  You may continue to have sex unless your health care provider tells you otherwise. Relieving pain and discomfort   Take frequent breaks and rest with your legs elevated if you have leg cramps or low back pain.  Take warm sitz baths to soothe any pain or discomfort caused by hemorrhoids. Use hemorrhoid cream if your health care provider approves.  Wear a good support bra to prevent discomfort from breast tenderness.  If you develop varicose veins: ? Wear support pantyhose or compression stockings as told by your healthcare provider. ? Elevate your feet for 15 minutes, 3-4 times a day. Prenatal care  Write down your questions. Take them to your prenatal visits.  Keep all your prenatal visits as told by your health care provider. This is important. Safety  Wear your seat belt at all times when driving.  Make a list of emergency phone numbers, including numbers for family, friends, the hospital, and police and fire departments. General instructions  Avoid cat litter boxes and soil used by cats. These carry germs that can cause birth defects in the baby. If you have a cat, ask someone to clean the litter box for you.  Do not travel far distances unless it is absolutely necessary and only with the approval of your health care provider.  Do not use hot tubs, steam rooms, or saunas.  Do not drink alcohol.  Do not use any products that contain nicotine or tobacco, such as cigarettes and e-cigarettes. If you need help quitting, ask your health care provider.  Do not use any medicinal herbs or unprescribed drugs. These chemicals affect the formation and growth of the baby.  Do not douche or use tampons or scented sanitary pads.  Do not cross your legs for long periods of time.  To prepare for the arrival of your baby: ? Take prenatal classes to understand, practice, and ask questions about labor and delivery. ? Make a trial run to the hospital. ? Visit the hospital and tour the maternity area. ? Arrange for maternity or paternity leave through employers. ? Arrange for family and friends to take  care of pets while you are in the hospital. ? Purchase a rear-facing car seat and make sure you know how to install it in your car. ? Pack your hospital bag. ? Prepare the baby's nursery. Make sure to remove all pillows and stuffed animals from the baby's crib to prevent suffocation.  Visit your dentist if you have not gone during your pregnancy. Use a soft toothbrush to brush your teeth and be   gentle when you floss. Contact a health care provider if:  You are unsure if you are in labor or if your water has broken.  You become dizzy.  You have mild pelvic cramps, pelvic pressure, or nagging pain in your abdominal area.  You have lower back pain.  You have persistent nausea, vomiting, or diarrhea.  You have an unusual or bad smelling vaginal discharge.  You have pain when you urinate. Get help right away if:  Your water breaks before 37 weeks.  You have regular contractions less than 5 minutes apart before 37 weeks.  You have a fever.  You are leaking fluid from your vagina.  You have spotting or bleeding from your vagina.  You have severe abdominal pain or cramping.  You have rapid weight loss or weight gain.  You have shortness of breath with chest pain.  You notice sudden or extreme swelling of your face, hands, ankles, feet, or legs.  Your baby makes fewer than 10 movements in 2 hours.  You have severe headaches that do not go away when you take medicine.  You have vision changes. Summary  The third trimester is from week 28 through week 40, months 7 through 9. The third trimester is a time when the unborn baby (fetus) is growing rapidly.  During the third trimester, your discomfort may increase as you and your baby continue to gain weight. You may have abdominal, leg, and back pain, sleeping problems, and an increased need to urinate.  During the third trimester your breasts will keep growing and they will continue to become tender. A yellow fluid (colostrum)  may leak from your breasts. This is the first milk you are producing for your baby.  False labor is a condition in which you feel small, irregular tightenings of the muscles in the womb (contractions) that eventually go away. These are called Braxton Hicks contractions. Contractions may last for hours, days, or even weeks before true labor sets in.  Signs of labor can include: abdominal cramps; regular contractions that start at 10 minutes apart and become stronger and more frequent with time; watery or bloody mucus discharge that comes from the vagina; increased pelvic pressure and dull back pain; and leaking of amniotic fluid. This information is not intended to replace advice given to you by your health care provider. Make sure you discuss any questions you have with your health care provider. Document Released: 07/09/2001 Document Revised: 08/20/2016 Document Reviewed: 08/20/2016 Elsevier Interactive Patient Education  2019 [employer]  

## 2018-11-23 ENCOUNTER — Other Ambulatory Visit: Payer: Self-pay | Admitting: *Deleted

## 2018-11-23 ENCOUNTER — Encounter: Payer: Self-pay | Admitting: *Deleted

## 2018-11-23 MED ORDER — CETIRIZINE HCL 10 MG PO TABS: 10.0000 mg | ORAL_TABLET | Freq: Every day | ORAL | 2 refills | Status: DC

## 2018-12-02 ENCOUNTER — Encounter: Payer: Medicaid Other | Admitting: Advanced Practice Midwife

## 2018-12-09 ENCOUNTER — Ambulatory Visit (INDEPENDENT_AMBULATORY_CARE_PROVIDER_SITE_OTHER): Payer: Medicaid Other | Admitting: Obstetrics and Gynecology

## 2018-12-09 ENCOUNTER — Other Ambulatory Visit: Payer: Self-pay

## 2018-12-09 DIAGNOSIS — Z3A33 33 weeks gestation of pregnancy: Secondary | ICD-10-CM | POA: Diagnosis not present

## 2018-12-09 DIAGNOSIS — Z3483 Encounter for supervision of other normal pregnancy, third trimester: Secondary | ICD-10-CM | POA: Diagnosis not present

## 2018-12-09 DIAGNOSIS — Z3480 Encounter for supervision of other normal pregnancy, unspecified trimester: Secondary | ICD-10-CM

## 2018-12-09 MED ORDER — PANTOPRAZOLE SODIUM 20 MG PO TBEC: 20.0000 mg | DELAYED_RELEASE_TABLET | Freq: Every day | ORAL | 1 refills | Status: DC

## 2018-12-09 NOTE — Progress Notes (Signed)
   TELEHEALTH VIRTUAL OBSTETRICS VISIT ENCOUNTER NOTE  I connected with Katrina Ortega on 12/09/18 at  1:45 PM EDT by telephone at home and verified that I am speaking with the correct person using two identifiers.   I discussed the limitations, risks, security and privacy concerns of performing an evaluation and management service by telephone and the availability of in person appointments. I also discussed with the patient that there may be a patient responsible charge related to this service. The patient expressed understanding and agreed to proceed.  Subjective:  Katrina Ortega is a 23 y.o. G1P0000 at [redacted]w[redacted]d being followed for ongoing prenatal care.  She is currently monitored for the following issues for this low-risk pregnancy and has Supervision of other normal pregnancy, antepartum; Depression; Group beta Strep positive; Abnormal Pap smear of cervix; and HPV (human papilloma virus) infection on their problem list.  Patient reports heartburn. Reports fetal movement. Denies any contractions, bleeding or leaking of fluid.   The following portions of the patient's history were reviewed and updated as appropriate: allergies, current medications, past family history, past medical history, past social history, past surgical history and problem list.   Objective:  There were no vitals filed for this visit.  Babyscripts Data Reviewed: yes  General:  Alert, oriented and cooperative.   Mental Status: Normal mood and affect perceived. Normal judgment and thought content.  Rest of physical exam deferred due to type of encounter  Assessment and Plan:  Pregnancy: G1P0000 at [redacted]w[redacted]d 1. Supervision of other normal pregnancy, antepartum rtc 3wks for in person rob. Needs ucx toc for gbs urine  Preterm labor symptoms and general obstetric precautions including but not limited to vaginal bleeding, contractions, leaking of fluid and fetal movement were reviewed in detail with the patient.  I discussed  the assessment and treatment plan with the patient. The patient was provided an opportunity to ask questions and all were answered. The patient agreed with the plan and demonstrated an understanding of the instructions. The patient was advised to call back or seek an in-person office evaluation/go to [facility] at [facility] for any urgent or concerning symptoms. Please refer to After Visit Summary for other counseling recommendations.   I provided 10 minutes of non-face-to-face time during this encounter. The visit was conducted via Web[employer]-medicine  Return in about 3 weeks (around 12/30/2018) for rob in person.  Future Appointments  Date Time Provider Department Center  12/30/2018 11:00 AM Calvert Cantor, CNM [facility]    Fidelity Bing, MD [facility], [facility]

## 2018-12-09 NOTE — Progress Notes (Signed)
I connected with  Katrina Ortega on 12/09/18 at  1:45 PM EDT by telephone and verified that I am speaking with the correct person using two identifiers.   I discussed the limitations, risks, security and privacy concerns of performing an evaluation and management service by telephone and the availability of in person appointments. I also discussed with the patient that there may be a patient responsible charge related to this service. The patient expressed understanding and agreed to proceed.  Rodgerick Gilliand Emeline Darling, CMA 12/09/2018  1:54 PM

## 2018-12-29 ENCOUNTER — Other Ambulatory Visit (HOSPITAL_COMMUNITY)
Admission: RE | Admit: 2018-12-29 | Discharge: 2018-12-29 | Disposition: A | Discharge status: Home / Self Care | Payer: Medicaid Other | Source: Ambulatory Visit | Attending: Obstetrics and Gynecology | Admitting: Obstetrics and Gynecology

## 2018-12-29 ENCOUNTER — Other Ambulatory Visit: Payer: Self-pay

## 2018-12-29 ENCOUNTER — Ambulatory Visit (INDEPENDENT_AMBULATORY_CARE_PROVIDER_SITE_OTHER): Payer: Medicaid Other | Admitting: Obstetrics and Gynecology

## 2018-12-29 VITALS — BP 135/78 | HR 70 | Wt 165.0 lb

## 2018-12-29 DIAGNOSIS — Z3A36 36 weeks gestation of pregnancy: Secondary | ICD-10-CM

## 2018-12-29 DIAGNOSIS — Z3480 Encounter for supervision of other normal pregnancy, unspecified trimester: Secondary | ICD-10-CM

## 2018-12-29 DIAGNOSIS — Z3483 Encounter for supervision of other normal pregnancy, third trimester: Secondary | ICD-10-CM

## 2018-12-29 NOTE — Progress Notes (Signed)
Prenatal Visit Note Date: 12/29/2018 Clinic: [facility]  Subjective:  Katrina Ortega is a 23 y.o. G1P0000 at [redacted]w[redacted]d being seen today for ongoing prenatal care.  She is currently monitored for the following issues for this low-risk pregnancy and has Supervision of other normal pregnancy, antepartum; Depression; Group beta Strep positive; Abnormal Pap smear of cervix; and HPV (human papilloma virus) infection on their problem list.  Patient reports no complaints.   Contractions: Not present. Vag. Bleeding: None.  Movement: Present. Denies leaking of fluid.   The following portions of the patient's history were reviewed and updated as appropriate: allergies, current medications, past family history, past medical history, past social history, past surgical history and problem list. Problem list updated.  Objective:   Vitals:   12/29/18 1456  BP: 140/77  Pulse: 70  Weight: 165 lb (74.8 kg)    Fetal Status: Fetal Heart Rate (bpm): 144 Fundal Height: 36 cm Movement: Present  Presentation: Vertex  General:  Alert, oriented and cooperative. Patient is in no acute distress.  Skin: Skin is warm and dry. No rash noted.   Cardiovascular: Normal heart rate noted  Respiratory: Normal respiratory effort, no problems with respiration noted  Abdomen: Soft, gravid, appropriate for gestational age. Pain/Pressure: Present     Pelvic:  Cervical exam performed Dilation: Fingertip Effacement (%): 50 Station: Ballotable  Extremities: Normal range of motion.     Mental Status: Normal mood and affect. Normal behavior. Normal judgment and thought content.   Urinalysis:      Assessment and Plan:  Pregnancy: G1P0000 at [redacted]w[redacted]d  1. Supervision of other normal pregnancy, antepartum Routine care. Already GBS pos - Cervicovaginal ancillary only( [place])  Preterm labor symptoms and general obstetric precautions including but not limited to vaginal bleeding, contractions, leaking of fluid  and fetal movement were reviewed in detail with the patient. Please refer to After Visit Summary for other counseling recommendations.  Return in about 2 weeks (around 01/12/2019).   Leake Bing, MD

## 2018-12-30 ENCOUNTER — Encounter: Payer: Medicaid Other | Admitting: Advanced Practice Midwife

## 2019-01-01 LAB — CERVICOVAGINAL ANCILLARY ONLY
Chlamydia: NEGATIVE
Neisseria Gonorrhea: NEGATIVE

## 2019-01-04 ENCOUNTER — Other Ambulatory Visit: Payer: Self-pay | Admitting: *Deleted

## 2019-01-04 MED ORDER — PANTOPRAZOLE SODIUM 20 MG PO TBEC: 20.0000 mg | DELAYED_RELEASE_TABLET | Freq: Every day | ORAL | 1 refills | Status: DC

## 2019-01-06 ENCOUNTER — Encounter: Payer: Self-pay | Admitting: Family Medicine

## 2019-01-06 ENCOUNTER — Other Ambulatory Visit: Payer: Self-pay

## 2019-01-06 ENCOUNTER — Ambulatory Visit (INDEPENDENT_AMBULATORY_CARE_PROVIDER_SITE_OTHER): Payer: Medicaid Other | Admitting: Family Medicine

## 2019-01-06 VITALS — BP 123/77 | HR 97 | Wt 171.0 lb

## 2019-01-06 DIAGNOSIS — Z3A37 37 weeks gestation of pregnancy: Secondary | ICD-10-CM

## 2019-01-06 DIAGNOSIS — O99343 Other mental disorders complicating pregnancy, third trimester: Secondary | ICD-10-CM

## 2019-01-06 DIAGNOSIS — B951 Streptococcus, group B, as the cause of diseases classified elsewhere: Secondary | ICD-10-CM

## 2019-01-06 DIAGNOSIS — F329 Major depressive disorder, single episode, unspecified: Secondary | ICD-10-CM

## 2019-01-06 DIAGNOSIS — Z3480 Encounter for supervision of other normal pregnancy, unspecified trimester: Secondary | ICD-10-CM

## 2019-01-06 DIAGNOSIS — O1203 Gestational edema, third trimester: Secondary | ICD-10-CM

## 2019-01-06 NOTE — Progress Notes (Signed)
   PRENATAL VISIT NOTE  Subjective:  Katrina Ortega is a 23 y.o. G1P0000 at [redacted]w[redacted]d being seen today for ongoing prenatal care.  She is currently monitored for the following issues for this low-risk pregnancy and has Supervision of other normal pregnancy, antepartum; Depression; Group beta Strep positive; Abnormal Pap smear of cervix; and HPV (human papilloma virus) infection on their problem list.  Patient reports leg pain, swelling.  Contractions: Irritability. Vag. Bleeding: None.  Movement: Present. Denies leaking of fluid.   The following portions of the patient's history were reviewed and updated as appropriate: allergies, current medications, past family history, past medical history, past social history, past surgical history and problem list.   Objective:   Vitals:   01/06/19 1520  BP: 123/77  Pulse: 97  Weight: 171 lb (77.6 kg)    Fetal Status: Fetal Heart Rate (bpm): 152 Fundal Height: 37 cm Movement: Present  Presentation: Vertex  General:  Alert, oriented and cooperative. Patient is in no acute distress.  Skin: Skin is warm and dry. No rash noted.   Cardiovascular: Normal heart rate noted  Respiratory: Normal respiratory effort, no problems with respiration noted  Abdomen: Soft, gravid, appropriate for gestational age.  Pain/Pressure: Present     Pelvic: Cervical exam deferred        Extremities: Normal range of motion.  Edema: Deep pitting, indentation remains for a short time  Mental Status: Normal mood and affect. Normal behavior. Normal judgment and thought content.   Assessment and Plan:  Pregnancy: G1P0000 at [redacted]w[redacted]d  1. Supervision of other normal pregnancy, antepartum Up to date BP wnl but is above 2nd trimester nadir and had one SBP>140. Patient instructed to take BP daily at home. Will follow up in office for BP check given risk factors for gHTN/PEC which including primigravida, AA and elevating BP without diagnosis of HTN at this point.    2. Depression  during pregnancy in third trimester  3. Group beta Strep positive Needs PCN in labor  4. Edema during pregnancy in third trimester - CBC - Comprehensive metabolic panel - Protein / creatinine ratio, urine - Reviewed s/sx of preeclampsia in detail.    Term labor symptoms and general obstetric precautions including but not limited to vaginal bleeding, contractions, leaking of fluid and fetal movement were reviewed in detail with the patient. Please refer to After Visit Summary for other counseling recommendations.   Return in about 1 week (around 01/13/2019) for Routine prenatal care- BP check , Scheduled prenatal/NST.  Future Appointments  Date Time Provider Department Center  01/12/2019  9:45 AM Donnamae Jude, MD [facility]    Caren Macadam, MD

## 2019-01-06 NOTE — Progress Notes (Signed)
Having painful swelling in legs and feet.

## 2019-01-07 LAB — COMPREHENSIVE METABOLIC PANEL
ALT: 15 IU/L (ref 0–32)
AST: 17 IU/L (ref 0–40)
Albumin/Globulin Ratio: 1.5 (ref 1.2–2.2)
Albumin: 3.4 g/dL — ABNORMAL LOW (ref 3.9–5.0)
Alkaline Phosphatase: 127 IU/L — ABNORMAL HIGH (ref 39–117)
BUN/Creatinine Ratio: 8 — ABNORMAL LOW (ref 9–23)
BUN: 5 mg/dL — ABNORMAL LOW (ref 6–20)
Bilirubin Total: 0.3 mg/dL (ref 0.0–1.2)
CO2: 23 mmol/L (ref 20–29)
Calcium: 8.4 mg/dL — ABNORMAL LOW (ref 8.7–10.2)
Chloride: 104 mmol/L (ref 96–106)
Creatinine, Ser: 0.6 mg/dL (ref 0.57–1.00)
GFR calc Af Amer: 148 mL/min/{1.73_m2} (ref >59)
GFR calc non Af Amer: 128 mL/min/{1.73_m2} (ref >59)
Globulin, Total: 2.3 g/dL (ref 1.5–4.5)
Glucose: 83 mg/dL (ref 65–99)
Potassium: 3.3 mmol/L — ABNORMAL LOW (ref 3.5–5.2)
Sodium: 138 mmol/L (ref 134–144)
Total Protein: 5.7 g/dL — ABNORMAL LOW (ref 6.0–8.5)

## 2019-01-07 LAB — PROTEIN / CREATININE RATIO, URINE
Creatinine, Urine: 34.1 mg/dL
Protein, Ur: 5.9 mg/dL
Protein/Creat Ratio: 173 mg/g creat (ref 0–200)

## 2019-01-07 LAB — CBC
Hematocrit: 29.9 % — ABNORMAL LOW (ref 34.0–46.6)
Hemoglobin: 10.2 g/dL — ABNORMAL LOW (ref 11.1–15.9)
MCH: 28.3 pg (ref 26.6–33.0)
MCHC: 34.1 g/dL (ref 31.5–35.7)
MCV: 83 fL (ref 79–97)
Platelets: 200 10*3/uL (ref 150–450)
RBC: 3.61 x10E6/uL — ABNORMAL LOW (ref 3.77–5.28)
RDW: 14.9 % (ref 11.7–15.4)
WBC: 6.8 10*3/uL (ref 3.4–10.8)

## 2019-01-12 ENCOUNTER — Ambulatory Visit (INDEPENDENT_AMBULATORY_CARE_PROVIDER_SITE_OTHER): Payer: Medicaid Other | Admitting: Family Medicine

## 2019-01-12 ENCOUNTER — Other Ambulatory Visit: Payer: Self-pay

## 2019-01-12 VITALS — BP 125/79 | HR 93 | Wt 165.0 lb

## 2019-01-12 DIAGNOSIS — Z3483 Encounter for supervision of other normal pregnancy, third trimester: Secondary | ICD-10-CM

## 2019-01-12 DIAGNOSIS — Z3689 Encounter for other specified antenatal screening: Secondary | ICD-10-CM

## 2019-01-12 DIAGNOSIS — Z3A38 38 weeks gestation of pregnancy: Secondary | ICD-10-CM

## 2019-01-12 DIAGNOSIS — Z3480 Encounter for supervision of other normal pregnancy, unspecified trimester: Secondary | ICD-10-CM

## 2019-01-12 NOTE — Progress Notes (Signed)
   PRENATAL VISIT NOTE  Subjective:  Katrina Ortega is a 23 y.o. G1P0000 at [redacted]w[redacted]d being seen today for ongoing prenatal care.  She is currently monitored for the following issues for this low-risk pregnancy and has Supervision of other normal pregnancy, antepartum; Depression; Group beta Strep positive; Abnormal Pap smear of cervix; and HPV (human papilloma virus) infection on their problem list.  Patient reports no complaints.  Contractions: Irritability. Vag. Bleeding: None.  Movement: Present. Denies leaking of fluid.   The following portions of the patient's history were reviewed and updated as appropriate: allergies, current medications, past family history, past medical history, past social history, past surgical history and problem list.   Objective:   Vitals:   01/12/19 1017  BP: 125/79  Pulse: 93  Weight: 165 lb (74.8 kg)    Fetal Status: Fetal Heart Rate (bpm): NST   Movement: Present     General:  Alert, oriented and cooperative. Patient is in no acute distress.  Skin: Skin is warm and dry. No rash noted.   Cardiovascular: Normal heart rate noted  Respiratory: Normal respiratory effort, no problems with respiration noted  Abdomen: Soft, gravid, appropriate for gestational age.  Pain/Pressure: Present     Pelvic: Cervical exam deferred        Extremities: Normal range of motion.  Edema: Mild pitting, slight indentation  Mental Status: Normal mood and affect. Normal behavior. Normal judgment and thought content.  NST:  Baseline: 135 bpm, Variability: Good {> 6 bpm), Accelerations: Reactive and Decelerations: Absent   Assessment and Plan:  Pregnancy: G1P0000 at [redacted]w[redacted]d 1. Supervision of other normal pregnancy, antepartum Doing well BP's borderline with weight gain and swelling last week. BP's ok on BAbyScripts this week. Weight is back down and swelling decreased. PIH precautions and labor signs reviewed in detail.  Preterm labor symptoms and general obstetric  precautions including but not limited to vaginal bleeding, contractions, leaking of fluid and fetal movement were reviewed in detail with the patient. Please refer to After Visit Summary for other counseling recommendations.   Return in about 1 week (around 01/19/2019) for ob visit.  Future Appointments  Date Time Provider Department Center  01/20/2019  9:45 AM Caren Macadam, MD [facility]    Donnamae Jude, MD

## 2019-01-12 NOTE — Patient Instructions (Signed)

## 2019-01-20 ENCOUNTER — Ambulatory Visit (INDEPENDENT_AMBULATORY_CARE_PROVIDER_SITE_OTHER): Payer: Medicaid Other | Admitting: Family Medicine

## 2019-01-20 ENCOUNTER — Other Ambulatory Visit: Payer: Self-pay

## 2019-01-20 ENCOUNTER — Encounter: Payer: Medicaid Other | Admitting: Family Medicine

## 2019-01-20 VITALS — BP 139/88 | HR 72 | Wt 167.6 lb

## 2019-01-20 DIAGNOSIS — Z3480 Encounter for supervision of other normal pregnancy, unspecified trimester: Secondary | ICD-10-CM

## 2019-01-20 DIAGNOSIS — O99820 Streptococcus B carrier state complicating pregnancy: Secondary | ICD-10-CM

## 2019-01-20 DIAGNOSIS — B951 Streptococcus, group B, as the cause of diseases classified elsewhere: Secondary | ICD-10-CM

## 2019-01-20 DIAGNOSIS — Z3A39 39 weeks gestation of pregnancy: Secondary | ICD-10-CM

## 2019-01-20 NOTE — Patient Instructions (Signed)

## 2019-01-20 NOTE — Progress Notes (Signed)
   PRENATAL VISIT NOTE  Subjective:  Katrina Ortega is a 22 y.o. G1P0000 at [redacted]w[redacted]d being seen today for ongoing prenatal care.  She is currently monitored for the following issues for this low-risk pregnancy and has Supervision of other normal pregnancy, antepartum; Depression; Group beta Strep positive; Abnormal Pap smear of cervix; and HPV (human papilloma virus) infection on their problem list.  Patient reports no complaints.  Contractions: Irregular. Vag. Bleeding: None.  Movement: Present. Denies leaking of fluid.   The following portions of the patient's history were reviewed and updated as appropriate: allergies, current medications, past family history, past medical history, past social history, past surgical history and problem list.   Objective:   Vitals:   01/20/19 1114  BP: 139/88  Pulse: 72  Weight: 167 lb 9.6 oz (76 kg)    Fetal Status:   Fundal Height: 38 cm Movement: Present  Presentation: Vertex  General:  Alert, oriented and cooperative. Patient is in no acute distress.  Skin: Skin is warm and dry. No rash noted.   Cardiovascular: Normal heart rate noted  Respiratory: Normal respiratory effort, no problems with respiration noted  Abdomen: Soft, gravid, appropriate for gestational age.  Pain/Pressure: Absent     Pelvic: Cervical exam performed Dilation: Fingertip Effacement (%): 50 Station: Ballotable  Extremities: Normal range of motion.  Edema: Moderate pitting, indentation subsides rapidly  Mental Status: Normal mood and affect. Normal behavior. Normal judgment and thought content.   Assessment and Plan:  Pregnancy: G1P0000 at [redacted]w[redacted]d 1. Supervision of other normal pregnancy, antepartum Continue routine prenatal care. IOL scheduled at 41 wks, orders placed Continue BPs on BabyScripts   2. Group beta Strep positive Will need treatment in labor  Term labor symptoms and general obstetric precautions including but not limited to vaginal bleeding, contractions,  leaking of fluid and fetal movement were reviewed in detail with the patient. Please refer to After Visit Summary for other counseling recommendations.   Return in 1 week (on 01/27/2019) for ob visit + NST, in person.  Future Appointments  Date Time Provider [place]  01/27/2019  2:15 PM Kieth Brightly [facility]  02/01/2019  7:00 AM [facility] SCHED ROOM [facility] None    Donnamae Jude, MD

## 2019-01-21 ENCOUNTER — Other Ambulatory Visit: Payer: Self-pay

## 2019-01-21 ENCOUNTER — Inpatient Hospital Stay (HOSPITAL_COMMUNITY)
Admission: AD | Admit: 2019-01-21 | Discharge: 2019-01-24 | DRG: 788 | Disposition: A | Discharge status: Home / Self Care | Payer: Medicaid Other | Attending: Family Medicine | Admitting: Family Medicine

## 2019-01-21 ENCOUNTER — Encounter (HOSPITAL_COMMUNITY): Payer: Self-pay | Admitting: *Deleted

## 2019-01-21 ENCOUNTER — Inpatient Hospital Stay (HOSPITAL_COMMUNITY)
Admission: AD | Admit: 2019-01-21 | Discharge: 2019-01-21 | Disposition: A | Discharge status: Home / Self Care | Payer: Medicaid Other | Source: Home / Self Care | Attending: Obstetrics and Gynecology | Admitting: Obstetrics and Gynecology

## 2019-01-21 ENCOUNTER — Encounter (HOSPITAL_COMMUNITY): Payer: Self-pay

## 2019-01-21 DIAGNOSIS — D649 Anemia, unspecified: Secondary | ICD-10-CM | POA: Diagnosis present

## 2019-01-21 DIAGNOSIS — Z3A41 41 weeks gestation of pregnancy: Secondary | ICD-10-CM | POA: Diagnosis present

## 2019-01-21 DIAGNOSIS — B951 Streptococcus, group B, as the cause of diseases classified elsewhere: Secondary | ICD-10-CM

## 2019-01-21 DIAGNOSIS — O9902 Anemia complicating childbirth: Secondary | ICD-10-CM | POA: Diagnosis present

## 2019-01-21 DIAGNOSIS — O99824 Streptococcus B carrier state complicating childbirth: Secondary | ICD-10-CM | POA: Diagnosis present

## 2019-01-21 DIAGNOSIS — O479 False labor, unspecified: Secondary | ICD-10-CM

## 2019-01-21 DIAGNOSIS — Z87891 Personal history of nicotine dependence: Secondary | ICD-10-CM

## 2019-01-21 DIAGNOSIS — Z1159 Encounter for screening for other viral diseases: Secondary | ICD-10-CM | POA: Diagnosis not present

## 2019-01-21 DIAGNOSIS — Z3A39 39 weeks gestation of pregnancy: Secondary | ICD-10-CM | POA: Diagnosis not present

## 2019-01-21 DIAGNOSIS — O480 Post-term pregnancy: Secondary | ICD-10-CM | POA: Diagnosis present

## 2019-01-21 DIAGNOSIS — B977 Papillomavirus as the cause of diseases classified elsewhere: Secondary | ICD-10-CM | POA: Diagnosis present

## 2019-01-21 DIAGNOSIS — R87619 Unspecified abnormal cytological findings in specimens from cervix uteri: Secondary | ICD-10-CM | POA: Diagnosis present

## 2019-01-21 HISTORY — DX: Other specified health status: Z78.9

## 2019-01-21 LAB — TYPE AND SCREEN
ABO/RH(D): A POS
Antibody Screen: NEGATIVE

## 2019-01-21 LAB — URINALYSIS, ROUTINE W REFLEX MICROSCOPIC
Bilirubin Urine: NEGATIVE
Glucose, UA: NEGATIVE mg/dL
Hgb urine dipstick: NEGATIVE
Ketones, ur: NEGATIVE mg/dL
Leukocytes,Ua: NEGATIVE
Nitrite: NEGATIVE
Protein, ur: NEGATIVE mg/dL
Specific Gravity, Urine: 1.008 (ref 1.005–1.030)
pH: 7 (ref 5.0–8.0)

## 2019-01-21 MED ORDER — SOD CITRATE-CITRIC ACID 500-334 MG/5ML PO SOLN: 30.0000 mL | ORAL | Status: DC | PRN

## 2019-01-21 MED ORDER — TERBUTALINE SULFATE 1 MG/ML IJ SOLN
0.2500 mg | Freq: Once | INTRAMUSCULAR | Status: AC | PRN
  Administered 2019-01-22: 0.25 mg via SUBCUTANEOUS
  Filled 2019-01-21: qty 1

## 2019-01-21 MED ORDER — OXYCODONE-ACETAMINOPHEN 5-325 MG PO TABS: 1.0000 | ORAL_TABLET | ORAL | Status: DC | PRN

## 2019-01-21 MED ORDER — LACTATED RINGERS IV SOLN
INTRAVENOUS | Status: DC
  Administered 2019-01-21 – 2019-01-22 (×2): via INTRAVENOUS

## 2019-01-21 MED ORDER — LIDOCAINE HCL (PF) 1 % IJ SOLN: 30.0000 mL | INTRAMUSCULAR | Status: DC | PRN

## 2019-01-21 MED ORDER — OXYCODONE-ACETAMINOPHEN 5-325 MG PO TABS: 2.0000 | ORAL_TABLET | ORAL | Status: DC | PRN

## 2019-01-21 MED ORDER — PENICILLIN G 3 MILLION UNITS IVPB - SIMPLE MED: 3.0000 10*6.[IU] | INTRAVENOUS | Status: DC

## 2019-01-21 MED ORDER — ACETAMINOPHEN 325 MG PO TABS: 650.0000 mg | ORAL_TABLET | ORAL | Status: DC | PRN

## 2019-01-21 MED ORDER — FENTANYL CITRATE (PF) 100 MCG/2ML IJ SOLN
100.0000 ug | INTRAMUSCULAR | Status: DC | PRN
  Administered 2019-01-21: 100 ug via INTRAVENOUS

## 2019-01-21 MED ORDER — ACETAMINOPHEN 325 MG PO TABS
650.0000 mg | ORAL_TABLET | Freq: Once | ORAL | Status: AC
  Administered 2019-01-21: 650 mg via ORAL
  Filled 2019-01-21: qty 2

## 2019-01-21 MED ORDER — ONDANSETRON HCL 4 MG/2ML IJ SOLN: 4.0000 mg | Freq: Four times a day (QID) | INTRAMUSCULAR | Status: DC | PRN

## 2019-01-21 MED ORDER — OXYTOCIN 40 UNITS IN NORMAL SALINE INFUSION - SIMPLE MED: 2.5000 [IU]/h | INTRAVENOUS | Status: DC

## 2019-01-21 MED ORDER — OXYTOCIN BOLUS FROM INFUSION: 500.0000 mL | Freq: Once | INTRAVENOUS | Status: DC

## 2019-01-21 MED ORDER — MISOPROSTOL 25 MCG QUARTER TABLET: 25.0000 ug | ORAL_TABLET | ORAL | Status: DC | PRN

## 2019-01-21 MED ORDER — FENTANYL CITRATE (PF) 100 MCG/2ML IJ SOLN
INTRAMUSCULAR | Status: AC
  Filled 2019-01-21: qty 2

## 2019-01-21 MED ORDER — LACTATED RINGERS IV SOLN: 500.0000 mL | INTRAVENOUS | Status: DC | PRN

## 2019-01-21 MED ORDER — SODIUM CHLORIDE 0.9 % IV SOLN: 5.0000 10*6.[IU] | Freq: Once | INTRAVENOUS | Status: DC

## 2019-01-21 MED ORDER — FLEET ENEMA 7-19 GM/118ML RE ENEM: 1.0000 | ENEMA | RECTAL | Status: DC | PRN

## 2019-01-21 MED ORDER — SODIUM CHLORIDE 0.9 % IV SOLN
2.0000 g | Freq: Once | INTRAVENOUS | Status: AC
  Administered 2019-01-21: 2 g via INTRAVENOUS
  Filled 2019-01-21: qty 2000

## 2019-01-21 NOTE — H&P (Addendum)
Obstetric Admission History & Physical  Subjective Katrina Ortega is a 23 y.o. female G1P0000 with IUP at 3520w3d by LMP admitted for rupture of membranes.  Reports fetal movement. Denies vaginal bleeding. [facility]  Support person in labor: FOB  Ultrasounds . 19+3 - anatomy - no abnormalities . 24+1 - f/u anatomy complete   Prenatal History/Complications: . Abnormal pap LSIL w/ HPV +  Past Medical History:  Diagnosis Date  . Medical history non-contributory    Past Surgical History:  Procedure Laterality Date  . NO PAST SURGERIES     OB History    Gravida  1   Para  0   Term  0   Preterm  0   AB  0   Living  0     SAB  0   TAB  0   Ectopic  0   Multiple  0   Live Births  0          Social History   Socioeconomic History  . Marital status: Single    Spouse name: Not on file  . Number of children: 0  . Years of education: 2516  . Highest education level: Not on file  Occupational History  . Not on file  Social Needs  . Financial resource strain: Not on file  . Food insecurity    Worry: Not on file    Inability: Not on file  . Transportation needs    Medical: Not on file    Non-medical: Not on file  Tobacco Use  . Smoking status: Former Games developermoker  . Smokeless tobacco: Never Used  Substance and Sexual Activity  . Alcohol use: No    Frequency: Never  . Drug use: No  . Sexual activity: Not Currently  Lifestyle  . Physical activity    Days per week: Not on file    Minutes per session: Not on file  . Stress: Not on file  Relationships  . Social Musicianconnections    Talks on phone: Not on file    Gets together: Not on file    Attends religious service: Not on file    Active member of club or organization: Not on file    Attends meetings of clubs or organizations: Not on file    Relationship status: Not on file  Other Topics Concern  . Not on file  Social History Narrative  . Not on file   Family History  Problem  Relation Age of Onset  . Multiple sclerosis Mother   . Hypertension Father   . Diabetes Maternal Uncle   . Diabetes Maternal Grandfather   . Diabetes Paternal Grandmother    Allergies: Allergies  Allergen Reactions  . Percocet [Oxycodone-Acetaminophen] Itching   Medications:  No outpatient medications have been marked as taking for the 01/21/19 encounter [facility](Hospital Encounter).    Review of Systems  All systems reviewed and negative except as stated in HPI  Objective Physical Exam:  LMP 04/20/2018 (Approximate)  General appearance: alert, cooperative and appears stated age Lungs: no respiratory distress Heart: RRR. No BLEE.  Abdomen: soft, non-tender; gravid  Pelvic: deferred Extremities: Moving spontaneously, warm, well perfused. 2+ DP.  Uterine activity: irritable Cervical Exam:    Presentation: cephalic Fetal monitoring: baseline 130 / mod variability/ +a / -d  Prenatal labs: ABO, Rh: --/--/PENDING (06/25 2258) Antibody: PENDING (06/25 2258) Rubella: 3.68 (01/22 1555) RPR: Non Reactive (04/08 0920)  HBsAg: Negative (01/22 1555)  HIV: Non Reactive (  04/08 0920)  GBS:   positive Glucola: nl Genetic screening:  declined  Prenatal Transfer Tool  Maternal Diabetes: No Genetic Screening: Declined Maternal Ultrasounds/Referrals: Normal Fetal Ultrasounds or other Referrals:  None Maternal Substance Abuse:  No Significant Maternal Medications:  None Significant Maternal Lab Results: Group B Strep positive  Results for orders placed or performed during the hospital encounter of 01/21/19 (from the past 24 hour(s))  Type and screen   Collection Time: 01/21/19 10:58 PM  Result Value Ref Range   ABO/RH(D) PENDING    Antibody Screen PENDING    Sample Expiration      01/24/2019,2359 Performed at [facility], [place] [address], [place], [place] [address]   Results for orders placed or performed during the hospital encounter of 01/21/19 (from the past 24 hour(s))   Urinalysis, Routine w reflex microscopic   Collection Time: 01/21/19  8:59 AM  Result Value Ref Range   Color, Urine STRAW (A) YELLOW   APPearance CLEAR CLEAR   Specific Gravity, Urine 1.008 1.005 - 1.030   pH 7.0 5.0 - 8.0   Glucose, UA NEGATIVE NEGATIVE mg/dL   Hgb urine dipstick NEGATIVE NEGATIVE   Bilirubin Urine NEGATIVE NEGATIVE   Ketones, ur NEGATIVE NEGATIVE mg/dL   Protein, ur NEGATIVE NEGATIVE mg/dL   Nitrite NEGATIVE NEGATIVE   Leukocytes,Ua NEGATIVE NEGATIVE    Patient Active Problem List   Diagnosis Date Noted  . Post term pregnancy at [redacted] weeks gestation 01/21/2019  . Abnormal Pap smear of cervix 09/16/2018  . 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 Group beta Strep positive 08/24/2018  . Supervision of other normal pregnancy, antepartum 08/19/2018  . Depression 08/19/2018    Assessment & Plan:  FRAIDA VELDMAN is a 24 y.o. G1P0000 at [redacted]w[redacted]d - [facility]   Labor: Expectant management  . Pain control: Epidural . Anticipated MOD: NSVD . PPH risk: low  Fetal Wellbeing:Category I tracing. . GBS:   positive > ampicillin . Continuous fetal monitoring  Postpartum Planning . Boy(yes)/breast/ depo . Rubella immune, Tdap provided during [facility]  . Pp colpo   Zettie Cooley, M.D.  Family Medicine  PGY-1 01/21/2019 11:19 PM   OB FELLOW HISTORY AND PHYSICAL ATTESTATION  I have seen and examined this patient; I agree with above documentation in the resident's note.   Phill Myron, D.O. OB Fellow  01/22/2019, 12:04 AM

## 2019-01-21 NOTE — Discharge Instructions (Signed)
Braxton Hicks Contractions Contractions of the uterus can occur throughout pregnancy, but they are not always a sign that you are in labor. You may have practice contractions called Braxton Hicks contractions. These false labor contractions are sometimes confused with true labor. What are Braxton Hicks contractions? Braxton Hicks contractions are tightening movements that occur in the muscles of the uterus before labor. Unlike true labor contractions, these contractions do not result in opening (dilation) and thinning of the cervix. Toward the end of pregnancy (32-34 weeks), Braxton Hicks contractions can happen more often and may become stronger. These contractions are sometimes difficult to tell apart from true labor because they can be very uncomfortable. You should not feel embarrassed if you go to the hospital with false labor. Sometimes, the only way to tell if you are in true labor is for your health care provider to look for changes in the cervix. The health care provider will do a physical exam and may monitor your contractions. If you are not in true labor, the exam should show that your cervix is not dilating and your water has not broken. If there are no other health problems associated with your pregnancy, it is completely safe for you to be sent home with false labor. You may continue to have Braxton Hicks contractions until you go into true labor. How to tell the difference between true labor and false labor True labor  Contractions last 30-70 seconds.  Contractions become very regular.  Discomfort is usually felt in the top of the uterus, and it spreads to the lower abdomen and low back.  Contractions do not go away with walking.  Contractions usually become more intense and increase in frequency.  The cervix dilates and gets thinner. False labor  Contractions are usually shorter and not as strong as true labor contractions.  Contractions are usually irregular.  Contractions  are often felt in the front of the lower abdomen and in the groin.  Contractions may go away when you walk around or change positions while lying down.  Contractions get weaker and are shorter-lasting as time goes on.  The cervix usually does not dilate or become thin. Follow these instructions at home:   Take over-the-counter and prescription medicines only as told by your health care provider.  Keep up with your usual exercises and follow other instructions from your health care provider.  Eat and drink lightly if you think you are going into labor.  If Braxton Hicks contractions are making you uncomfortable: ? Change your position from lying down or resting to walking, or change from walking to resting. ? Sit and rest in a tub of warm water. ? Drink enough fluid to keep your urine pale yellow. Dehydration may cause these contractions. ? Do slow and deep breathing several times an hour.  Keep all follow-up prenatal visits as told by your health care provider. This is important. Contact a health care provider if:  You have a fever.  You have continuous pain in your abdomen. Get help right away if:  Your contractions become stronger, more regular, and closer together.  You have fluid leaking or gushing from your vagina.  You pass blood-tinged mucus (bloody show).  You have bleeding from your vagina.  You have low back pain that you never had before.  You feel your baby's head pushing down and causing pelvic pressure.  Your baby is not moving inside you as much as it used to. Summary  Contractions that occur before labor are   called Braxton Hicks contractions, false labor, or practice contractions.  Braxton Hicks contractions are usually shorter, weaker, farther apart, and less regular than true labor contractions. True labor contractions usually become progressively stronger and regular, and they become more frequent.  Manage discomfort from Braxton Hicks contractions  by changing position, resting in a warm bath, drinking plenty of water, or practicing deep breathing. This information is not intended to replace advice given to you by your health care provider. Make sure you discuss any questions you have with your health care provider. Document Released: 11/28/2016 Document Revised: 04/29/2017 Document Reviewed: 11/28/2016 Elsevier Interactive Patient Education  2019 Elsevier Inc.  

## 2019-01-21 NOTE — MAU Note (Signed)
Presents with poss ROM and cxts every minute since approx 2200. Pos FM. Pt GBS pos. Unable to ask nursing assessment questions due to discomfort from cxts  [place]

## 2019-01-22 ENCOUNTER — Inpatient Hospital Stay (HOSPITAL_COMMUNITY): Payer: Medicaid Other | Admitting: Anesthesiology

## 2019-01-22 ENCOUNTER — Encounter (HOSPITAL_COMMUNITY): Payer: Self-pay | Admitting: Anesthesiology

## 2019-01-22 ENCOUNTER — Encounter (HOSPITAL_COMMUNITY)
Admission: AD | Disposition: A | Discharge status: Home / Self Care | Payer: Self-pay | Source: Home / Self Care | Attending: Family Medicine

## 2019-01-22 DIAGNOSIS — O99824 Streptococcus B carrier state complicating childbirth: Secondary | ICD-10-CM

## 2019-01-22 DIAGNOSIS — Z3A39 39 weeks gestation of pregnancy: Secondary | ICD-10-CM

## 2019-01-22 LAB — CBC
HCT: 34.9 % — ABNORMAL LOW (ref 36.0–46.0)
HCT: 30.6 % — ABNORMAL LOW (ref 36.0–46.0)
HCT: 29 % — ABNORMAL LOW (ref 36.0–46.0)
Hemoglobin: 11.6 g/dL — ABNORMAL LOW (ref 12.0–15.0)
Hemoglobin: 10.2 g/dL — ABNORMAL LOW (ref 12.0–15.0)
Hemoglobin: 9.6 g/dL — ABNORMAL LOW (ref 12.0–15.0)
MCH: 28.6 pg (ref 26.0–34.0)
MCH: 28.7 pg (ref 26.0–34.0)
MCH: 28.6 pg (ref 26.0–34.0)
MCHC: 33.2 g/dL (ref 30.0–36.0)
MCHC: 33.3 g/dL (ref 30.0–36.0)
MCHC: 33.1 g/dL (ref 30.0–36.0)
MCV: 86 fL (ref 80.0–100.0)
MCV: 86 fL (ref 80.0–100.0)
MCV: 86.3 fL (ref 80.0–100.0)
Platelets: 185 10*3/uL (ref 150–400)
Platelets: 163 10*3/uL (ref 150–400)
Platelets: 170 10*3/uL (ref 150–400)
RBC: 4.06 MIL/uL (ref 3.87–5.11)
RBC: 3.56 MIL/uL — ABNORMAL LOW (ref 3.87–5.11)
RBC: 3.36 MIL/uL — ABNORMAL LOW (ref 3.87–5.11)
RDW: 15.4 % (ref 11.5–15.5)
RDW: 15.4 % (ref 11.5–15.5)
RDW: 15.4 % (ref 11.5–15.5)
WBC: 9.8 10*3/uL (ref 4.0–10.5)
WBC: 14.2 10*3/uL — ABNORMAL HIGH (ref 4.0–10.5)
WBC: 14.6 10*3/uL — ABNORMAL HIGH (ref 4.0–10.5)
nRBC: 0 % (ref 0.0–0.2)
nRBC: 0 % (ref 0.0–0.2)
nRBC: 0 % (ref 0.0–0.2)

## 2019-01-22 LAB — CREATININE, SERUM
Creatinine, Ser: 0.71 mg/dL (ref 0.44–1.00)
GFR calc Af Amer: >60 mL/min (ref >60)
GFR calc non Af Amer: >60 mL/min (ref >60)

## 2019-01-22 LAB — SARS CORONAVIRUS 2 BY RT PCR (HOSPITAL ORDER, PERFORMED IN ~~LOC~~ HOSPITAL LAB): SARS Coronavirus 2: NEGATIVE

## 2019-01-22 LAB — RPR: RPR Ser Ql: NONREACTIVE

## 2019-01-22 LAB — ABO/RH: ABO/RH(D): A POS

## 2019-01-22 LAB — POCT FERN TEST: POCT Fern Test: POSITIVE

## 2019-01-22 SURGERY — Surgical Case
Anesthesia: Epidural

## 2019-01-22 MED ORDER — WITCH HAZEL-GLYCERIN EX PADS: 1.0000 "application " | MEDICATED_PAD | CUTANEOUS | Status: DC | PRN

## 2019-01-22 MED ORDER — KETOROLAC TROMETHAMINE 30 MG/ML IJ SOLN: 30.0000 mg | Freq: Four times a day (QID) | INTRAMUSCULAR | Status: AC | PRN

## 2019-01-22 MED ORDER — NALBUPHINE HCL 10 MG/ML IJ SOLN: 5.0000 mg | INTRAMUSCULAR | Status: DC | PRN

## 2019-01-22 MED ORDER — ONDANSETRON HCL 4 MG/2ML IJ SOLN: 4.0000 mg | Freq: Three times a day (TID) | INTRAMUSCULAR | Status: DC | PRN

## 2019-01-22 MED ORDER — DEXAMETHASONE SODIUM PHOSPHATE 10 MG/ML IJ SOLN
INTRAMUSCULAR | Status: DC | PRN
  Administered 2019-01-22: 10 mg via INTRAVENOUS

## 2019-01-22 MED ORDER — KETAMINE HCL 50 MG/5ML IJ SOSY
PREFILLED_SYRINGE | INTRAMUSCULAR | Status: AC
  Filled 2019-01-22: qty 5

## 2019-01-22 MED ORDER — NALBUPHINE HCL 10 MG/ML IJ SOLN: 5.0000 mg | Freq: Once | INTRAMUSCULAR | Status: DC | PRN

## 2019-01-22 MED ORDER — TETANUS-DIPHTH-ACELL PERTUSSIS 5-2.5-18.5 LF-MCG/0.5 IM SUSP: 0.5000 mL | Freq: Once | INTRAMUSCULAR | Status: DC

## 2019-01-22 MED ORDER — FENTANYL CITRATE (PF) 100 MCG/2ML IJ SOLN
INTRAMUSCULAR | Status: AC
  Filled 2019-01-22: qty 2

## 2019-01-22 MED ORDER — MORPHINE SULFATE (PF) 0.5 MG/ML IJ SOLN
INTRAMUSCULAR | Status: AC
  Filled 2019-01-22: qty 10

## 2019-01-22 MED ORDER — KETOROLAC TROMETHAMINE 30 MG/ML IJ SOLN
INTRAMUSCULAR | Status: AC
  Filled 2019-01-22: qty 1

## 2019-01-22 MED ORDER — SODIUM CHLORIDE 0.9 % IR SOLN
Status: DC | PRN
  Administered 2019-01-22: 1

## 2019-01-22 MED ORDER — OXYCODONE HCL 5 MG PO TABS
5.0000 mg | ORAL_TABLET | ORAL | Status: DC | PRN
  Administered 2019-01-23 (×2): 5 mg via ORAL
  Filled 2019-01-22 (×2): qty 1

## 2019-01-22 MED ORDER — PHENYLEPHRINE 40 MCG/ML (10ML) SYRINGE FOR IV PUSH (FOR BLOOD PRESSURE SUPPORT): 80.0000 ug | PREFILLED_SYRINGE | INTRAVENOUS | Status: DC | PRN

## 2019-01-22 MED ORDER — SODIUM CHLORIDE 0.9 % IV SOLN
INTRAVENOUS | Status: DC | PRN
  Administered 2019-01-22: 01:00:00 via INTRAVENOUS

## 2019-01-22 MED ORDER — IBUPROFEN 800 MG PO TABS
800.0000 mg | ORAL_TABLET | Freq: Three times a day (TID) | ORAL | Status: DC
  Administered 2019-01-22 – 2019-01-24 (×7): 800 mg via ORAL
  Filled 2019-01-22 (×7): qty 1

## 2019-01-22 MED ORDER — MEPERIDINE HCL 25 MG/ML IJ SOLN: 6.2500 mg | INTRAMUSCULAR | Status: DC | PRN

## 2019-01-22 MED ORDER — DIPHENHYDRAMINE HCL 50 MG/ML IJ SOLN: 12.5000 mg | INTRAMUSCULAR | Status: DC | PRN

## 2019-01-22 MED ORDER — HYDROMORPHONE HCL 1 MG/ML IJ SOLN: 0.2500 mg | INTRAMUSCULAR | Status: DC | PRN

## 2019-01-22 MED ORDER — OXYTOCIN 40 UNITS IN NORMAL SALINE INFUSION - SIMPLE MED: 2.5000 [IU]/h | INTRAVENOUS | Status: AC

## 2019-01-22 MED ORDER — OXYTOCIN 40 UNITS IN NORMAL SALINE INFUSION - SIMPLE MED
INTRAVENOUS | Status: AC
  Filled 2019-01-22: qty 1000

## 2019-01-22 MED ORDER — ONDANSETRON HCL 4 MG/2ML IJ SOLN
INTRAMUSCULAR | Status: DC | PRN
  Administered 2019-01-22: 4 mg via INTRAVENOUS

## 2019-01-22 MED ORDER — EPHEDRINE 5 MG/ML INJ: 10.0000 mg | INTRAVENOUS | Status: DC | PRN

## 2019-01-22 MED ORDER — LACTATED RINGERS IV SOLN
INTRAVENOUS | Status: DC
  Administered 2019-01-22: 10:00:00 via INTRAVENOUS

## 2019-01-22 MED ORDER — PRENATAL MULTIVITAMIN CH
1.0000 | ORAL_TABLET | Freq: Every day | ORAL | Status: DC
  Administered 2019-01-22 – 2019-01-24 (×3): 1 via ORAL
  Filled 2019-01-22 (×3): qty 1

## 2019-01-22 MED ORDER — NALOXONE HCL 0.4 MG/ML IJ SOLN: 0.4000 mg | INTRAMUSCULAR | Status: DC | PRN

## 2019-01-22 MED ORDER — ENOXAPARIN SODIUM 40 MG/0.4ML ~~LOC~~ SOLN
40.0000 mg | SUBCUTANEOUS | Status: DC
  Administered 2019-01-23 – 2019-01-24 (×2): 40 mg via SUBCUTANEOUS
  Filled 2019-01-22 (×2): qty 0.4

## 2019-01-22 MED ORDER — FENTANYL-BUPIVACAINE-NACL 0.5-0.125-0.9 MG/250ML-% EP SOLN
EPIDURAL | Status: AC
  Filled 2019-01-22: qty 250

## 2019-01-22 MED ORDER — ONDANSETRON HCL 4 MG/2ML IJ SOLN
INTRAMUSCULAR | Status: AC
  Filled 2019-01-22: qty 2

## 2019-01-22 MED ORDER — PROMETHAZINE HCL 25 MG/ML IJ SOLN: 6.2500 mg | INTRAMUSCULAR | Status: DC | PRN

## 2019-01-22 MED ORDER — SCOPOLAMINE 1 MG/3DAYS TD PT72
1.0000 | MEDICATED_PATCH | Freq: Once | TRANSDERMAL | Status: DC
  Filled 2019-01-22: qty 1

## 2019-01-22 MED ORDER — SCOPOLAMINE 1 MG/3DAYS TD PT72
MEDICATED_PATCH | TRANSDERMAL | Status: AC
  Filled 2019-01-22: qty 1

## 2019-01-22 MED ORDER — LIDOCAINE-EPINEPHRINE (PF) 2 %-1:200000 IJ SOLN
INTRAMUSCULAR | Status: DC | PRN
  Administered 2019-01-22: 10 mL via EPIDURAL

## 2019-01-22 MED ORDER — FENTANYL CITRATE (PF) 100 MCG/2ML IJ SOLN
INTRAMUSCULAR | Status: DC | PRN
  Administered 2019-01-22: 100 ug via INTRAVENOUS

## 2019-01-22 MED ORDER — MIDAZOLAM HCL 2 MG/2ML IJ SOLN
INTRAMUSCULAR | Status: AC
  Filled 2019-01-22: qty 2

## 2019-01-22 MED ORDER — SENNOSIDES-DOCUSATE SODIUM 8.6-50 MG PO TABS
2.0000 | ORAL_TABLET | ORAL | Status: DC
  Administered 2019-01-22 – 2019-01-23 (×2): 2 via ORAL
  Filled 2019-01-22 (×2): qty 2

## 2019-01-22 MED ORDER — COCONUT OIL OIL
1.0000 "application " | TOPICAL_OIL | Status: DC | PRN
  Administered 2019-01-23: 1 via TOPICAL

## 2019-01-22 MED ORDER — NALOXONE HCL 4 MG/10ML IJ SOLN
1.0000 ug/kg/h | INTRAVENOUS | Status: DC | PRN
  Filled 2019-01-22: qty 5

## 2019-01-22 MED ORDER — LIDOCAINE HCL (PF) 1 % IJ SOLN
INTRAMUSCULAR | Status: DC | PRN
  Administered 2019-01-22: 7 mL via EPIDURAL
  Administered 2019-01-22: 5 mL via EPIDURAL

## 2019-01-22 MED ORDER — LIDOCAINE-EPINEPHRINE (PF) 2 %-1:200000 IJ SOLN
INTRAMUSCULAR | Status: AC
  Filled 2019-01-22: qty 10

## 2019-01-22 MED ORDER — METOCLOPRAMIDE HCL 5 MG/ML IJ SOLN
INTRAMUSCULAR | Status: AC
  Filled 2019-01-22: qty 2

## 2019-01-22 MED ORDER — FENTANYL-BUPIVACAINE-NACL 0.5-0.125-0.9 MG/250ML-% EP SOLN: 12.0000 mL/h | EPIDURAL | Status: DC | PRN

## 2019-01-22 MED ORDER — SIMETHICONE 80 MG PO CHEW: 80.0000 mg | CHEWABLE_TABLET | ORAL | Status: DC | PRN

## 2019-01-22 MED ORDER — DIPHENHYDRAMINE HCL 25 MG PO CAPS
25.0000 mg | ORAL_CAPSULE | Freq: Four times a day (QID) | ORAL | Status: DC | PRN
  Administered 2019-01-22: 25 mg via ORAL
  Filled 2019-01-22: qty 1

## 2019-01-22 MED ORDER — SODIUM CHLORIDE 0.9 % IV SOLN
INTRAVENOUS | Status: DC | PRN
  Administered 2019-01-22: 40 [IU] via INTRAVENOUS

## 2019-01-22 MED ORDER — DEXAMETHASONE SODIUM PHOSPHATE 10 MG/ML IJ SOLN
INTRAMUSCULAR | Status: AC
  Filled 2019-01-22: qty 1

## 2019-01-22 MED ORDER — SIMETHICONE 80 MG PO CHEW
80.0000 mg | CHEWABLE_TABLET | Freq: Three times a day (TID) | ORAL | Status: DC
  Administered 2019-01-22 – 2019-01-24 (×7): 80 mg via ORAL
  Filled 2019-01-22 (×7): qty 1

## 2019-01-22 MED ORDER — KETOROLAC TROMETHAMINE 30 MG/ML IJ SOLN
30.0000 mg | Freq: Once | INTRAMUSCULAR | Status: AC | PRN
  Administered 2019-01-22: 30 mg via INTRAVENOUS

## 2019-01-22 MED ORDER — SIMETHICONE 80 MG PO CHEW
80.0000 mg | CHEWABLE_TABLET | ORAL | Status: DC
  Administered 2019-01-22 – 2019-01-23 (×2): 80 mg via ORAL
  Filled 2019-01-22 (×2): qty 1

## 2019-01-22 MED ORDER — GABAPENTIN 100 MG PO CAPS
100.0000 mg | ORAL_CAPSULE | Freq: Three times a day (TID) | ORAL | Status: DC
  Administered 2019-01-22 – 2019-01-24 (×7): 100 mg via ORAL
  Filled 2019-01-22 (×7): qty 1

## 2019-01-22 MED ORDER — ACETAMINOPHEN 500 MG PO TABS
1000.0000 mg | ORAL_TABLET | Freq: Four times a day (QID) | ORAL | Status: AC
  Administered 2019-01-22 – 2019-01-23 (×4): 1000 mg via ORAL
  Filled 2019-01-22 (×4): qty 2

## 2019-01-22 MED ORDER — MENTHOL 3 MG MT LOZG: 1.0000 | LOZENGE | OROMUCOSAL | Status: DC | PRN

## 2019-01-22 MED ORDER — CEFAZOLIN SODIUM-DEXTROSE 2-4 GM/100ML-% IV SOLN
INTRAVENOUS | Status: AC
  Filled 2019-01-22: qty 100

## 2019-01-22 MED ORDER — DIPHENHYDRAMINE HCL 25 MG PO CAPS
25.0000 mg | ORAL_CAPSULE | ORAL | Status: DC | PRN
  Administered 2019-01-22: 25 mg via ORAL
  Filled 2019-01-22: qty 1

## 2019-01-22 MED ORDER — CEFAZOLIN SODIUM-DEXTROSE 2-3 GM-%(50ML) IV SOLR
INTRAVENOUS | Status: DC | PRN
  Administered 2019-01-22: 2 g via INTRAVENOUS

## 2019-01-22 MED ORDER — DIBUCAINE (PERIANAL) 1 % EX OINT: 1.0000 "application " | TOPICAL_OINTMENT | CUTANEOUS | Status: DC | PRN

## 2019-01-22 MED ORDER — SODIUM CHLORIDE (PF) 0.9 % IJ SOLN
INTRAMUSCULAR | Status: DC | PRN
  Administered 2019-01-22: 12 mL/h via EPIDURAL

## 2019-01-22 MED ORDER — MIDAZOLAM HCL 2 MG/2ML IJ SOLN
INTRAMUSCULAR | Status: DC | PRN
  Administered 2019-01-22: 2 mg via INTRAVENOUS

## 2019-01-22 MED ORDER — LACTATED RINGERS IV SOLN: 500.0000 mL | Freq: Once | INTRAVENOUS | Status: DC

## 2019-01-22 MED ORDER — SODIUM CHLORIDE 0.9% FLUSH: 3.0000 mL | INTRAVENOUS | Status: DC | PRN

## 2019-01-22 MED ORDER — MORPHINE SULFATE (PF) 0.5 MG/ML IJ SOLN
INTRAMUSCULAR | Status: DC | PRN
  Administered 2019-01-22: 3 mg via EPIDURAL

## 2019-01-22 SURGICAL SUPPLY — 30 items
BRR ADH 6X5 SEPRAFILM 1 SHT (MISCELLANEOUS)
CHLORAPREP W/TINT 26ML (MISCELLANEOUS) ×2 IMPLANT
CLAMP CORD UMBIL (MISCELLANEOUS) IMPLANT
DRSG OPSITE POSTOP 4X10 (GAUZE/BANDAGES/DRESSINGS) ×2 IMPLANT
ELECT REM PT RETURN 9FT ADLT (ELECTROSURGICAL) ×2
ELECTRODE REM PT RTRN 9FT ADLT (ELECTROSURGICAL) ×1 IMPLANT
EXTRACTOR VACUUM M CUP 4 TUBE (SUCTIONS) IMPLANT
GLOVE BIOGEL PI IND STRL 6.5 (GLOVE) ×1 IMPLANT
GLOVE BIOGEL PI IND STRL 7.0 (GLOVE) ×1 IMPLANT
GLOVE BIOGEL PI INDICATOR 6.5 (GLOVE) ×1
GLOVE BIOGEL PI INDICATOR 7.0 (GLOVE) ×1
GLOVE SURG SS PI 6.0 STRL IVOR (GLOVE) ×2 IMPLANT
GOWN STRL REUS W/TWL LRG LVL3 (GOWN DISPOSABLE) ×4 IMPLANT
KIT ABG SYR 3ML LUER SLIP (SYRINGE) IMPLANT
NDL HYPO 25X5/8 SAFETYGLIDE (NEEDLE) IMPLANT
NEEDLE HYPO 25X5/8 SAFETYGLIDE (NEEDLE) IMPLANT
NS IRRIG 1000ML POUR BTL (IV SOLUTION) ×2 IMPLANT
PACK C SECTION WH (CUSTOM PROCEDURE TRAY) ×2 IMPLANT
PAD ABD 7.5X8 STRL (GAUZE/BANDAGES/DRESSINGS) ×1 IMPLANT
PAD OB MATERNITY 4.3X12.25 (PERSONAL CARE ITEMS) ×2 IMPLANT
PENCIL SMOKE EVAC W/HOLSTER (ELECTROSURGICAL) ×2 IMPLANT
RTRCTR C-SECT PINK 25CM LRG (MISCELLANEOUS) IMPLANT
SEPRAFILM MEMBRANE 5X6 (MISCELLANEOUS) IMPLANT
SPONGE GAUZE 4X4 12PLY STER LF (GAUZE/BANDAGES/DRESSINGS) ×2 IMPLANT
SUT PLAIN 0 NONE (SUTURE) IMPLANT
SUT VIC AB 0 CT1 36 (SUTURE) ×8 IMPLANT
SUT VIC AB 4-0 KS 27 (SUTURE) ×2 IMPLANT
TOWEL OR 17X24 6PK STRL BLUE (TOWEL DISPOSABLE) ×2 IMPLANT
TRAY FOLEY W/BAG SLVR 14FR LF (SET/KITS/TRAYS/PACK) ×2 IMPLANT
WATER STERILE IRR 1000ML POUR (IV SOLUTION) ×3 IMPLANT

## 2019-01-22 NOTE — Lactation Note (Signed)
This note was copied from a baby's chart. Lactation Consultation Note  Patient Name: Katrina Ortega TIRWE'R Date: 01/22/2019 Reason for consult: Initial assessment;Term;Primapara;1st time breastfeeding  Visited with P59 Mom of term baby at 59 hrs old.  Mom laid back with baby in football hold on her right side.  Baby noted to be a little shallow, so offered to help relatch him deeper.  Basic breastfeeding teaching reviewed.  Undressed baby to provide STS.  Baby opens wide and latches deeply.  Mom denies any discomfort, and swallow identified.  Encouraged Mom to use breast compression to increase milk transfer.   Encouraged STS with baby.   Lactation brochure left in room.  Mom aware of IP and OP lactation support available.  Encouraged her to call prn for assistance.   Maternal Data Formula Feeding for Exclusion: No Has patient been taught Hand Expression?: Yes Does the patient have breastfeeding experience prior to this delivery?: No  Feeding Feeding Type: Breast Fed  LATCH Score Latch: Grasps breast easily, tongue down, lips flanged, rhythmical sucking.  Audible Swallowing: A few with stimulation  Type of Nipple: Everted at rest and after stimulation  Comfort (Breast/Nipple): Soft / non-tender  Hold (Positioning): Assistance needed to correctly position infant at breast and maintain latch.  LATCH Score: 8  Interventions Interventions: Breast feeding basics reviewed;Assisted with latch;Skin to skin;Breast massage;Hand express;Breast compression;Adjust position;Support pillows;Position options;Expressed milk  Lactation Tools Discussed/Used WIC Program: Yes   Consult Status Consult Status: Follow-up Date: 01/23/19 Follow-up type: In-patient    Broadus John 01/22/2019, 1:19 PM

## 2019-01-22 NOTE — Transfer of Care (Signed)
Immediate Anesthesia Transfer of Care Note  Patient: Katrina Ortega  Procedure(s) Performed: CESAREAN SECTION (N/A )  Patient Location: PACU  Anesthesia Type: Epidural  Level of Consciousness: awake  Airway & Oxygen Therapy: Patient Spontanous Breathing  Post-op Assessment: Report given to RN and Post -op Vital signs reviewed and stable  Post vital signs: Reviewed and stable  Last Vitals:  Vitals Value Taken Time  BP 97/46 01/22/19 0145  Temp    Pulse 123 01/22/19 0146  Resp 24 01/22/19 0146  SpO2 100 % 01/22/19 0146  Vitals shown include unvalidated device data.  Last Pain:  Vitals:   01/22/19 0029  TempSrc:   PainSc: 10-Worst pain ever      Patients Stated Pain Goal: 8 (27/78/24 2353)  Complications: No apparent anesthesia complications

## 2019-01-22 NOTE — Anesthesia Postprocedure Evaluation (Signed)
Anesthesia Post Note  Patient: Katrina Ortega  Procedure(s) Performed: CESAREAN SECTION (N/A )     Patient location during evaluation: Mother Baby Anesthesia Type: Epidural Level of consciousness: awake and alert, oriented and patient cooperative Pain management: pain level controlled Vital Signs Assessment: post-procedure vital signs reviewed and stable Respiratory status: spontaneous breathing Cardiovascular status: stable Postop Assessment: no headache, epidural receding, patient able to bend at knees and no signs of nausea or vomiting Anesthetic complications: no Comments: Pt.  Interviewed via phone consultation d/t COVID 19 precautions.  Pt.  States she is walking and pain score 0.     Last Vitals:  Vitals:   01/22/19 0730 01/22/19 1154  BP:  130/89  Pulse:  90  Resp: 18 19  Temp: 37.1 C 36.7 C  SpO2: 100% 98%    Last Pain:  Vitals:   01/22/19 1519  TempSrc:   PainSc: 0-No pain   Pain Goal: Patients Stated Pain Goal: 8 (01/21/19 2323)                 Rico Sheehan

## 2019-01-22 NOTE — Anesthesia Preprocedure Evaluation (Addendum)
Anesthesia Evaluation  Patient identified by MRN, date of birth, ID band Patient awake    Reviewed: Allergy & Precautions, H&P , NPO status , Patient's Chart, lab work & pertinent test results  Airway Mallampati: I  TM Distance: >3 FB Neck ROM: full    Dental no notable dental hx. (+) Teeth Intact   Pulmonary former smoker,    Pulmonary exam normal breath sounds clear to auscultation       Cardiovascular negative cardio ROS Normal cardiovascular exam Rhythm:regular Rate:Normal     Neuro/Psych Depression negative neurological ROS     GI/Hepatic negative GI ROS, Neg liver ROS,   Endo/Other  negative endocrine ROS  Renal/GU negative Renal ROS     Musculoskeletal   Abdominal Normal abdominal exam  (+)   Peds  Hematology  (+) Blood dyscrasia, anemia ,   Anesthesia Other Findings   Reproductive/Obstetrics (+) Pregnancy                             Anesthesia Physical Anesthesia Plan  ASA: II  Anesthesia Plan: Epidural   Post-op Pain Management:    Induction:   PONV Risk Score and Plan:   Airway Management Planned:   Additional Equipment:   Intra-op Plan:   Post-operative Plan:   Informed Consent: I have reviewed the patients History and Physical, chart, labs and discussed the procedure including the risks, benefits and alternatives for the proposed anesthesia with the patient or authorized representative who has indicated his/her understanding and acceptance.       Plan Discussed with:   Anesthesia Plan Comments: (For stat C/S for fetal bradycardia!)       Anesthesia Quick Evaluation

## 2019-01-22 NOTE — Anesthesia Procedure Notes (Addendum)
Epidural Patient location during procedure: OB Start time: 01/22/2019 12:28 AM End time: 01/22/2019 12:32 AM  Staffing Anesthesiologist: Lyn Hollingshead, MD Performed: anesthesiologist   Preanesthetic Checklist Completed: patient identified, site marked, surgical consent, pre-op evaluation, timeout performed, IV checked, risks and benefits discussed and monitors and equipment checked  Epidural Patient position: sitting Prep: site prepped and draped and DuraPrep Patient monitoring: continuous pulse ox and blood pressure Approach: midline Location: L3-L4 Injection technique: LOR air  Needle:  Needle type: Tuohy  Needle gauge: 17 G Needle length: 9 cm and 9 Needle insertion depth: 6 cm Catheter type: closed end flexible Catheter size: 19 Gauge Catheter at skin depth: 11 cm Test dose: negative and Other  Assessment Sensory level: T9 Events: blood not aspirated, injection not painful, no injection resistance, negative IV test and no paresthesia  Additional Notes Reason for block:procedure for pain

## 2019-01-22 NOTE — Op Note (Signed)
Mellissa Kohut PROCEDURE DATE: 01/21/2019 - 01/22/2019  PREOPERATIVE DIAGNOSIS: Intrauterine pregnancy at  [redacted]w[redacted]d weeks gestation; non-reassuring fetal status  POSTOPERATIVE DIAGNOSIS: The same  PROCEDURE:     Cesarean Section  SURGEON:  Dr. Mora Bellman  ASSISTANT: none  INDICATIONS: Katrina Ortega is a 23 y.o. G1P0000 at [redacted]w[redacted]d scheduled for cesarean section secondary to non-reassuring fetal status.  The risks of cesarean section discussed with the patient included but were not limited to: bleeding which may require transfusion or reoperation; infection which may require antibiotics; injury to bowel, bladder, ureters or other surrounding organs; injury to the fetus; need for additional procedures including hysterectomy in the event of a life-threatening hemorrhage; placental abnormalities wth subsequent pregnancies, incisional problems, thromboembolic phenomenon and other postoperative/anesthesia complications. The patient concurred with the proposed plan, giving informed written consent for the procedure.    FINDINGS:  Viable female infant in cephalic presentation. Nuchal cord x 2. Apgars 8 and 8.  Clear amniotic fluid.  Intact placenta, three vessel cord.  Normal uterus, fallopian tubes and ovaries bilaterally.  ANESTHESIA:    Epidural INTRAVENOUS FLUIDS:1300 ml ESTIMATED BLOOD LOSS: 189 ml URINE OUTPUT:  200 ml SPECIMENS: Placenta sent to L&D COMPLICATIONS: None immediate  PROCEDURE IN DETAIL:  The patient received intravenous antibiotics and had sequential compression devices applied to her lower extremities while in the preoperative area.  She was then taken to the operating room where anesthesia was induced and was found to be adequate. A foley catheter was placed into her bladder and attached to Melayah Skorupski gravity. She was then placed in a dorsal supine position with a leftward tilt, and prepped and draped in a sterile manner. After an adequate timeout was performed, a Pfannenstiel skin  incision was made with scalpel and carried through to the underlying layer of fascia. The fascia was incised in the midline and this incision was extended bilaterally using the Mayo scissors. Kocher clamps were applied to the superior aspect of the fascial incision and the underlying rectus muscles were dissected off bluntly. A similar process was carried out on the inferior aspect of the facial incision. The rectus muscles were separated in the midline bluntly and the peritoneum was entered bluntly. The Alexis self-retaining retractor was introduced into the abdominal cavity. Attention was turned to the lower uterine segment where a transverse hysterotomy was made with a scalpel and extended bilaterally bluntly. The infant was successfully delivered, and cord was clamped and cut and infant was handed over to awaiting neonatology team. Uterine massage was then administered and the placenta delivered intact with three-vessel cord. The uterus was cleared of clot and debris.  The hysterotomy was closed with 0 Vicryl in a running locked fashion, and an imbricating layer was also placed with a 0 Vicryl. Overall, excellent hemostasis was noted. The pelvis copiously irrigated and cleared of all clot and debris. Hemostasis was confirmed on all surfaces.  The peritoneum and the muscles were reapproximated using 0 vicryl interrupted stitches. The fascia was then closed using 0 Vicryl in a running fashion.  The skin was closed in a subcuticular fashion using 3.0 Vicryl. The patient tolerated the procedure well. Sponge, lap, instrument and needle counts were correct x 2. She was taken to the recovery room in stable condition.    Jackalyn Haith ConstantMD  01/22/2019 1:25 AM

## 2019-01-22 NOTE — Progress Notes (Signed)
CSW received consult for hx of Anxiety and Depression.  CSW met with MOB to offer support and complete assessment.    Upon entering the room CSW observed that MOB was in bed nursing infant while another individual slept on couch. CSW congratulated MOB on the birth of infant and advised Mob of the reason for the visit. MOB reports that she was diagnosed with depression in 217. MOB expressed that's he was never placed on medication. MOB reports that she wasn't in therapy but is interested in therapy. CSW offered MOB resources and MOB expressed that she would prefer resources for [place]. CSW understanding of this.   MOB expressed that she has been feeling fine since giving birth but hasn't had any sleep. CSW notified that  Mob also has not had a chance to choose pediatrician at this time. CSW understanding and offered other resources to MOB -MOB declined.   CSW provided education regarding the baby blues period vs. perinatal mood disorders, discussed treatment and gave resources for mental health follow up if concerns arise.  CSW recommends self-evaluation during the postpartum time period using the New Mom Checklist from Postpartum Progress and encouraged MOB to contact a medical professional if symptoms are noted at any time.   CSW provided review of Sudden Infant Death Syndrome (SIDS) precautions.   CSW identifies no further need for intervention and no barriers to discharge at this time.    Kierra S. Wiley, MSW, LCSW-A [facility] [phone]  

## 2019-01-23 MED ORDER — MEDROXYPROGESTERONE ACETATE 150 MG/ML IM SUSP
150.0000 mg | Freq: Once | INTRAMUSCULAR | Status: AC
  Administered 2019-01-23: 150 mg via INTRAMUSCULAR
  Filled 2019-01-23: qty 1

## 2019-01-23 NOTE — Progress Notes (Signed)
Post Partum Day 1 Subjective: no complaints, up ad lib, voiding, tolerating PO and + flatus  Objective: Blood pressure (!) 116/59, pulse 79, temperature 97.6 F (36.4 C), temperature source Oral, resp. rate 18, height 5\' 7"  (1.702 m), weight 76 kg, last menstrual period 04/20/2018, SpO2 97 %, unknown if currently breastfeeding.  Physical Exam:  General: alert, cooperative and appears stated age 23: appropriate Uterine Fundus: firm Incision: no significant drainage, no significant erythema DVT Evaluation: No evidence of DVT seen on physical exam.  Recent Labs    01/22/19 0526 01/22/19 0923  HGB 10.2* 9.6*  HCT 30.6* 29.0*    Assessment/Plan: Plan for discharge tomorrow   LOS: 2 days   Jorje Guild 01/23/2019, 10:47 AM

## 2019-01-23 NOTE — Progress Notes (Deleted)
Post Op Day 1  Subjective Up ad lib, voiding, tolerating PO, and + flatus. Denies dizziness, lightheadedness, tachycardia. Appropriate Lochia. Pain well controlled.  Objective BP (!) 116/59   Pulse 79   Temp 97.6 F (36.4 C) (Oral)   Resp 18   Ht 5\' 7"  (1.702 m)   Wt 76 kg   LMP 04/20/2018 (Approximate)   SpO2 97%   Breastfeeding Unknown   BMI 26.24 kg/m  Intake/Output      06/26 0701 - 06/27 0700   Urine (mL/kg/hr) 2650 (1.5)   Total Output 2650   Net -2650       Urine Occurrence 1 x     Physical Exam:  General: alert, cooperative, appears stated age, fatigued, and no distress Lungs: CTAB, no crackles or wheezes Heart: RRR, no m/r/r.  Lochia: appropriate Uterine Fundus: firm Incision: no significant drainage DVT Evaluation: No evidence of DVT seen on physical exam, extremities warm and well perfused.   Recent Labs    01/21/19 2258 01/22/19 0526 01/22/19 0923  HGB 11.6* 10.2* 9.6*  HCT 34.9* 30.6* 29.0*    Assessment & Plan Post Op Day # 1 . Plan for in 1-2 days. . Continue daily lactation  . Contraception: depo prior to discharge . Keep incision site clean and dry . Circumcision: yes- in or out?     LOS: 2 days   Katrina Ortega 01/23/2019, 6:09 AM

## 2019-01-24 MED ORDER — ACETAMINOPHEN 500 MG PO TABS: 1000.0000 mg | ORAL_TABLET | Freq: Four times a day (QID) | ORAL | 0 refills | Status: DC | PRN

## 2019-01-24 MED ORDER — OXYCODONE HCL 5 MG PO TABS: 5.0000 mg | ORAL_TABLET | Freq: Four times a day (QID) | ORAL | 0 refills | Status: AC | PRN

## 2019-01-24 MED ORDER — ACETAMINOPHEN 500 MG PO TABS
1000.0000 mg | ORAL_TABLET | Freq: Four times a day (QID) | ORAL | Status: DC | PRN
  Administered 2019-01-24: 09:00:00 1000 mg via ORAL
  Filled 2019-01-24: qty 2

## 2019-01-24 MED ORDER — SENNOSIDES-DOCUSATE SODIUM 8.6-50 MG PO TABS: 2.0000 | ORAL_TABLET | Freq: Every evening | ORAL | Status: DC | PRN

## 2019-01-24 NOTE — Lactation Note (Signed)
This note was copied from a baby's chart. Lactation Consultation Note  Patient Name: Katrina Ortega [id number] Date: 01/24/2019 Reason for consult: Term;Primapara;1st time breastfeeding  P1 mother whose infant is now 66 hours old.  Baby was asleep in mother's arms when I arrived.  Mother had no immediate questions/concerns related to breast feeding.  She feels like breast feeding is going well.  Mother stated that her nipples are slightly painful.  Upon observation, mother's breasts are soft and filling and nipples are everted and intact.  Suggested mother rub EBM into nipples/areolas after feedings before using coconut oil.    Mother will continue feeding 8-12 times/24 hours or sooner if baby shows feeding cues.  She is familiar with feeding cues and hand expression.    Engorgement prevention/treatment discussed.  Manual pump provided with instructions for use.  #24 flange size is appropriate at this time.  Mother has a DEBP for home use.  She has our OP phone number for questions after discharge.  Father present.   Maternal Data Has patient been taught Hand Expression?: Yes Does the patient have breastfeeding experience prior to this delivery?: No  Feeding Feeding Type: Breast Fed  LATCH Score                   Interventions    Lactation Tools Discussed/Used     Consult Status Consult Status: Complete Date: 01/24/19 Follow-up type: Call as needed    Katrina Ortega 01/24/2019, 11:29 AM

## 2019-01-24 NOTE — Discharge Summary (Signed)
Obstetrics Discharge Summary OB/GYN Faculty Practice   Patient Name: Katrina Ortega DOB: 12-13-1995 MRN: [id number]  Date of admission: 01/21/2019 Delivering MD: Catalina AntiguaONSTANT, PEGGY   Date of discharge: 01/24/2019  Admitting diagnosis: [place] CTX  Intrauterine pregnancy: 2517w4d     Secondary diagnosis:   Principal Problem:   Cesarean delivery delivered Active Problems:   Group beta Strep positive   Abnormal Pap smear of cervix   HPV (human papilloma virus) infection    Discharge diagnosis: Term Pregnancy Delivered, Emergency C/S for prolonged deceleration           Postpartum procedures: None  Complications: None  Outpatient Follow-Up: [ ]  incision check  Hospital course: Katrina Ortega is a 23 y.o. 2517w4d who was admitted for rupture of membranes. Her pregnancy was complicated by above noted. Her labor course was notable for prolonged fetal deceleration necessitating emergency C/S. Delivery was otherwise uncomplicated. Please see delivery/op note for additional details. Her HgB trended from 10.2 to 9.6 on POD#1. Her postpartum course was uncomplicated. She was breastfeeding with only minor difficulty but was able to work with lactation. By day of discharge, she was passing flatus, urinating, eating and drinking without difficulty. Her pain was well-controlled, and she was discharged home with tylenol and oxycodone for pain. She will follow-up in clinic in 1-2 weeks for an incision check and in 4-6 weeks for a postpartum visit.  Physical exam  Vitals:   01/23/19 0538 01/23/19 1323 01/23/19 2149 01/24/19 0510  BP: (!) 116/59 129/66 113/71 125/76  Pulse: 79 76 80 73  Resp: 18 18 16 18   Temp: 97.6 F (36.4 C) 98.8 F (37.1 C) 98.2 F (36.8 C) 98.5 F (36.9 C)  TempSrc: Oral Oral Oral Oral  SpO2:   100%   Weight:      Height:       General: well-appearing, NAD, breastfeeding Lochia: appropriate Uterine Fundus: firm Incision: Dressing is clean, dry, and intact DVT  Evaluation: No significant calf/ankle edema  Labs: Lab Results  Component Value Date   WBC 14.6 (H) 01/22/2019   HGB 9.6 (L) 01/22/2019   HCT 29.0 (L) 01/22/2019   MCV 86.3 01/22/2019   PLT 170 01/22/2019   CMP Latest Ref Rng & Units 01/22/2019  Glucose 65 - 99 mg/dL -  BUN 6 - 20 mg/dL -  Creatinine 9.560.44 - 2.131.00 mg/dL 0.860.71  Sodium 578134 - 469144 mmol/L -  Potassium 3.5 - 5.2 mmol/L -  Chloride 96 - 106 mmol/L -  CO2 20 - 29 mmol/L -  Calcium 8.7 - 10.2 mg/dL -  Total Protein 6.0 - 8.5 g/dL -  Total Bilirubin 0.0 - 1.2 mg/dL -  Alkaline Phos 39 - 629117 IU/L -  AST 0 - 40 IU/L -  ALT 0 - 32 IU/L -    Discharge instructions: Per After Visit Summary and "Baby and Me Booklet"  After visit meds:  Allergies as of 01/24/2019      Reactions   Percocet [oxycodone-acetaminophen] Itching      Medication List    STOP taking these medications   aspirin EC 81 MG tablet     TAKE these medications   acetaminophen 500 MG tablet Commonly known as: TYLENOL Take 2 tablets (1,000 mg total) by mouth every 6 (six) hours as needed for mild pain or moderate pain.   cetirizine 10 MG tablet Commonly known as: ZyrTEC Allergy Take 1 tablet (10 mg total) by mouth daily.   oxyCODONE 5 MG immediate release tablet  Commonly known as: Oxy IR/ROXICODONE Take 1 tablet (5 mg total) by mouth every 6 (six) hours as needed for up to 5 days for severe pain.   pantoprazole 20 MG tablet Commonly known as: PROTONIX Take 1 tablet (20 mg total) by mouth daily.   phenylephrine-shark liver oil-mineral oil-petrolatum 0.25-3-14-71.9 % rectal ointment Commonly known as: PREPARATION H Place 1 application rectally 2 (two) times daily as needed for hemorrhoids.   prenatal multivitamin Tabs tablet Take 1 tablet by mouth daily at 12 noon.   senna-docusate 8.6-50 MG tablet Commonly known as: Senokot-S Take 2 tablets by mouth at bedtime as needed for mild constipation.            Discharge Care Instructions   (From admission, onward)         Start     Ordered   01/24/19 0000  Discharge wound care:    Comments: Okay to shower like normal. You can remove your dressing in 1 week or wait until your incision check to have the nurse help you remove it.   01/24/19 0836         Postpartum contraception: Depo Provera Diet: Routine Diet Activity: Advance as tolerated. Pelvic rest for 6 weeks.   Follow-up Appt: Future Appointments  Date Time Provider [place]  01/27/2019  2:15 PM Darlina Rumpf, CNM [facility]   Follow-up Visit:No follow-ups on file. Please schedule this patient for Postpartum visit in: 4 weeks with the following provider: Any provider For C/S patients schedule nurse incision check in weeks 2 weeks: yes Low risk pregnancy complicated by: none Delivery mode:  CS Anticipated Birth Control:  Depo PP Procedures needed: Incision check  Schedule Integrated BH visit: no  Newborn Data: Live born female  Birth Weight: 6 lb 12.5 oz (3075 g) APGAR: 71, 8  Newborn Delivery   Birth date/time: 01/22/2019 00:59:00 Delivery type: C-Section, Low Transverse Trial of labor: Yes C-section categorization: Primary     Baby Feeding: Breast Disposition:home with mother   Lambert Mody. Juleen China, DO OB/GYN Fellow

## 2019-01-27 ENCOUNTER — Encounter: Payer: Medicaid Other | Admitting: Advanced Practice Midwife

## 2019-02-01 ENCOUNTER — Inpatient Hospital Stay (HOSPITAL_COMMUNITY): Payer: Medicaid Other

## 2019-02-04 ENCOUNTER — Other Ambulatory Visit: Payer: Self-pay | Admitting: Obstetrics and Gynecology

## 2019-02-09 ENCOUNTER — Other Ambulatory Visit: Payer: Self-pay

## 2019-02-09 ENCOUNTER — Ambulatory Visit (INDEPENDENT_AMBULATORY_CARE_PROVIDER_SITE_OTHER): Payer: Medicaid Other | Admitting: *Deleted

## 2019-02-09 VITALS — BP 108/72 | HR 82 | Temp 98.7°F | Ht 67.0 in | Wt 141.6 lb

## 2019-02-09 DIAGNOSIS — Z4889 Encounter for other specified surgical aftercare: Secondary | ICD-10-CM

## 2019-02-09 NOTE — Progress Notes (Signed)
Patient seen and assessed by nursing staff.  Agree with documentation and plan.  

## 2019-02-09 NOTE — Progress Notes (Signed)
   Subjective:     Katrina Ortega is a 23 y.o. female who presents to the clinic 2 weeks status post c-section for delivery of baby. Eating a regular diet without difficulty. Bowel movements are normal. The patient is not having any pain.  The following portions of the patient's history were reviewed and updated as appropriate: allergies, current medications, past family history, past medical history, past social history, past surgical history and problem list.  Review of Systems Pertinent items are noted in HPI.    Objective:    BP 108/72 (BP Location: Right Arm, Patient Position: Sitting, Cuff Size: Normal)   Pulse 82   Temp 98.7 F (37.1 C) (Oral)   Ht 5\' 7"  (1.702 m)   Wt 141 lb 9.6 oz (64.2 kg)   Breastfeeding Yes   BMI 22.18 kg/m  General:  alert, cooperative and no distress  Abdomen: soft, non-tender  Incision:   healing well, no drainage, no erythema, no hernia, no seroma, no swelling, no dehiscence, incision well approximated     Assessment:    Doing well postoperatively.   Plan:    1. Continue any current medications. 2. Wound care discussed. 3. Activity restrictions: no lifting more than 20-25 pounds 4. Anticipated return to work: not applicable. 5. Follow up: 2 weeks for Postpartum.   Derl Barrow, RN

## 2019-02-22 ENCOUNTER — Other Ambulatory Visit: Payer: Self-pay | Admitting: *Deleted

## 2019-02-22 ENCOUNTER — Ambulatory Visit: Payer: Medicaid Other | Admitting: Family Medicine

## 2019-02-22 MED ORDER — CETIRIZINE HCL 10 MG PO TABS: 10.0000 mg | ORAL_TABLET | Freq: Every day | ORAL | 2 refills | Status: DC

## 2019-02-22 NOTE — Progress Notes (Deleted)
Post Partum Exam  Katrina Ortega is a 23 y.o. G5P1001 female who presents for a postpartum visit. She is {1-10:13787} {time; units:18646} postpartum following a {delivery:12449}. I have fully reviewed the prenatal and intrapartum course. The delivery was at *** gestational weeks.  Anesthesia: {anesthesia types:812}. Postpartum course has been ***. Baby's course has been ***. Baby is feeding by {breast/bottle:69}. Bleeding {vag bleed:12292}. Bowel function is {normal:32111}. Bladder function is {normal:32111}. Patient {is/is not:9024} sexually active. Contraception method is {contraceptive method:5051}. Postpartum depression screening:neg  {Common ambulatory SmartLinks:19316} Last pap smear done *** and was {Desc; normal/abnormal:11317::"Normal"}  Review of Systems {ros; complete:30496}    Objective:  currently breastfeeding.  General:  {gen appearance:16600}   Breasts:  {breast exam:1202::"inspection negative, no nipple discharge or bleeding, no masses or nodularity palpable"}  Lungs: {lung exam:16931}  Heart:  {heart exam:5510}  Abdomen: {abdomen exam:16834}   Vulva:  {labia exam:12198}  Vagina: {vagina exam:12200}  Cervix:  {cervix exam:14595}  Corpus: {uterus exam:12215}  Adnexa:  {adnexa exam:12223}  Rectal Exam: {rectal/vaginal exam:12274}        Assessment:    *** postpartum exam. Pap smear {done:10129} at today's visit.   Plan:   1. Contraception: {method:5051} 2. *** 3. Follow up in: {1-10:13787} {time; units:19136} or as needed.

## 2019-02-25 ENCOUNTER — Ambulatory Visit (INDEPENDENT_AMBULATORY_CARE_PROVIDER_SITE_OTHER): Payer: Medicaid Other | Admitting: Obstetrics and Gynecology

## 2019-02-25 ENCOUNTER — Other Ambulatory Visit: Payer: Self-pay

## 2019-02-25 ENCOUNTER — Encounter: Payer: Self-pay | Admitting: Obstetrics and Gynecology

## 2019-02-25 VITALS — BP 104/67 | HR 88 | Ht 67.0 in | Wt 142.0 lb

## 2019-02-25 DIAGNOSIS — N905 Atrophy of vulva: Secondary | ICD-10-CM

## 2019-02-25 DIAGNOSIS — N941 Unspecified dyspareunia: Secondary | ICD-10-CM

## 2019-02-25 DIAGNOSIS — B3731 Acute candidiasis of vulva and vagina: Secondary | ICD-10-CM

## 2019-02-25 DIAGNOSIS — B9689 Other specified bacterial agents as the cause of diseases classified elsewhere: Secondary | ICD-10-CM

## 2019-02-25 DIAGNOSIS — R87612 Low grade squamous intraepithelial lesion on cytologic smear of cervix (LGSIL): Secondary | ICD-10-CM

## 2019-02-25 DIAGNOSIS — B373 Candidiasis of vulva and vagina: Secondary | ICD-10-CM

## 2019-02-25 DIAGNOSIS — N952 Postmenopausal atrophic vaginitis: Secondary | ICD-10-CM

## 2019-02-25 MED ORDER — MICONAZOLE NITRATE 2 % VA CREA: 1.0000 | TOPICAL_CREAM | Freq: Every day | VAGINAL | 0 refills | Status: DC

## 2019-02-25 MED ORDER — METRONIDAZOLE 500 MG PO TABS: 500.0000 mg | ORAL_TABLET | Freq: Two times a day (BID) | ORAL | 0 refills | Status: AC

## 2019-02-25 NOTE — Progress Notes (Signed)
Obstetrics and Gynecology Postpartum Visit  Appointment Date: 02/25/2019  [facility]  Primary Care Provider: Patient, No Pcp Per  Chief Complaint:  Chief Complaint  Patient presents with  . Postpartum Care    History of Present Illness: Katrina Ortega is a 23 y.o. African-American G1P1001 (No LMP recorded.), seen for the above chief complaint. Her past medical history is significant for nothing   She is s/p pLTCS on 6/26 for NRFHT @ 39wks; she was discharged to home on POD#2  Vaginal bleeding or discharge: No  Breast or formula feeding: breast Intercourse: Yes. Some vag dryness and discomfort.  Contraception after delivery: depo provera prior to discharge PP depression s/s: No  Any bowel or bladder issues: No  Pap smear: LSIL (date: 07/2018)  Review of Systems: r as noted in the History of Present Illness.  Patient Active Problem List   Diagnosis Date Noted  . Cesarean delivery delivered 01/24/2019  . Abnormal Pap smear of cervix 09/16/2018  . 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 Group beta Strep positive 08/24/2018  . Supervision of other normal pregnancy, antepartum 08/19/2018  . Depression 08/19/2018    Medications Katrina Ortega had no medications administered during this visit. Current Outpatient Medications  Medication Sig Dispense Refill  . cetirizine (ZYRTEC ALLERGY) 10 MG tablet Take 1 tablet (10 mg total) by mouth daily. 30 tablet 2  . pantoprazole (PROTONIX) 20 MG tablet TAKE 1 TABLET BY MOUTH EVERY DAY 30 tablet 1  . Prenatal Vit-Fe Fumarate-FA (PRENATAL MULTIVITAMIN) TABS tablet Take 1 tablet by mouth daily at 12 noon.    . senna-docusate (SENOKOT-S) 8.6-50 MG tablet Take 2 tablets by mouth at bedtime as needed for mild constipation.    Marland Kitchen. acetaminophen (TYLENOL) 500 MG tablet Take 2 tablets (1,000 mg total) by mouth every 6 (six) hours as needed for mild pain or moderate pain. 30 tablet 0  .  phenylephrine-shark liver oil-mineral oil-petrolatum (PREPARATION H) 0.25-3-14-71.9 % rectal ointment Place 1 application rectally 2 (two) times daily as needed for hemorrhoids.     No current facility-administered medications for this visit.     Allergies Percocet [oxycodone-acetaminophen]  Physical Exam:  BP 104/67   Pulse 88   Ht 5\' 7"  (1.702 m)   Wt 142 lb (64.4 kg)   BMI 22.24 kg/m  Body mass index is 22.24 kg/m. General appearance: Well nourished, well developed female in no acute distress.  Cardiovascular: normal s1 and s2.  No murmurs, rubs or gallops. Respiratory:  Clear to auscultation bilateral. Normal respiratory effort Abdomen: positive bowel sounds and no masses, hernias; diffusely non tender to palpation, non distended. C/d/i incision Neuro/Psych:  Normal mood and affect.  Skin:  Warm and dry.  Lymphatic:  No inguinal lymphadenopathy.   Pelvic exam: is not limited by body habitus EGBUS: within normal limits, mild atrophy Vagina: mild atrophy, within normal limits and with no blood in the vault, but with white-yellow d/c in vault. Cervix:  no lesions or cervical motion tenderness Uterus:  nonenlarged Adnexa:  normal adnexa and no mass, fullness, tenderness Rectovaginal: deferred  Laboratory: none  PP Depression Screening:  EPDS score 6  Assessment: pt doing well  Plan:  Rpt depo in 2 months. Monistat 7 and flagyl for BV and yeast. Pt told to let us know if has dyspareunia as may benefit from vaginal estrogen. Pt told to use plenty of lubrication too.   D/w pt recommend rpt pap early 2021  RTC  2m depo, 49m annual and pap  Durene Romans MD Attending [facility])

## 2019-02-27 ENCOUNTER — Encounter: Payer: Self-pay | Admitting: Obstetrics and Gynecology

## 2019-04-12 ENCOUNTER — Ambulatory Visit (INDEPENDENT_AMBULATORY_CARE_PROVIDER_SITE_OTHER): Payer: Medicaid Other

## 2019-04-12 VITALS — BP 118/70 | HR 72 | Wt 147.0 lb

## 2019-04-12 DIAGNOSIS — Z3042 Encounter for surveillance of injectable contraceptive: Secondary | ICD-10-CM | POA: Diagnosis not present

## 2019-04-12 MED ORDER — MEDROXYPROGESTERONE ACETATE 150 MG/ML IM SUSP: 150.0000 mg | INTRAMUSCULAR | 0 refills | Status: DC

## 2019-04-12 MED ORDER — MEDROXYPROGESTERONE ACETATE 150 MG/ML IM SUSP
150.0000 mg | Freq: Once | INTRAMUSCULAR | Status: AC
  Administered 2019-04-12: 15:00:00 150 mg via INTRAMUSCULAR

## 2019-04-12 NOTE — Progress Notes (Signed)
Patient presented to the office today for Depo-provera injection. Patient was unaware she was supposed to go and pick up her depo provera at the pharmacy. The first dose was given here and  I have advised her going forward she will need to pick her medication up and bring it to the office every three months. Depo-provera has been called into her pharmacy.     Given by: D.Heylee Tant 150 mg  HCG Serum: Not indicated Last pap: 08/19/2018

## 2019-05-26 ENCOUNTER — Other Ambulatory Visit: Payer: Self-pay | Admitting: *Deleted

## 2019-05-26 MED ORDER — CETIRIZINE HCL 10 MG PO TABS: 10.0000 mg | ORAL_TABLET | Freq: Every day | ORAL | 2 refills | Status: AC

## 2019-07-01 ENCOUNTER — Other Ambulatory Visit: Payer: Self-pay

## 2019-07-01 ENCOUNTER — Ambulatory Visit (INDEPENDENT_AMBULATORY_CARE_PROVIDER_SITE_OTHER): Payer: Medicaid Other | Admitting: *Deleted

## 2019-07-01 VITALS — BP 105/66 | HR 28

## 2019-07-01 DIAGNOSIS — F32A Depression, unspecified: Secondary | ICD-10-CM

## 2019-07-01 DIAGNOSIS — Z3042 Encounter for surveillance of injectable contraceptive: Secondary | ICD-10-CM

## 2019-07-01 DIAGNOSIS — F329 Major depressive disorder, single episode, unspecified: Secondary | ICD-10-CM

## 2019-07-01 MED ORDER — MEDROXYPROGESTERONE ACETATE 150 MG/ML IM SUSP
150.0000 mg | Freq: Once | INTRAMUSCULAR | Status: AC
  Administered 2019-07-01: 150 mg via INTRAMUSCULAR

## 2019-07-01 NOTE — Progress Notes (Signed)
Date last pap: 08/19/2018. Last Depo-Provera: 04/12/2019. Side Effects if any: None. Serum HCG indicated? NA Depo-Provera 150 mg IM given by: Crosby Oyster, RN. Next appointment due Feb 18-Mar 4.  While pt was getting depo injection, pt verbalized she was feeling depressed and wanted to see and talk to someone about it. Pt wasn't sure if it was the "baby blues" or the depo causing her to feel this way.   Had pt take PHQ 2-9 screening, pt scored 16. Pt willing to be referred to [facility] and also had her make appt to see provider as soon as we could get her in.   Crosby Oyster, RN

## 2019-07-05 NOTE — Progress Notes (Signed)
Patient seen and assessed by nursing staff during this encounter. I have reviewed the chart and agree with the documentation and plan.  Aletha Halim, MD 07/05/2019 9:20 AM

## 2019-07-08 NOTE — BH Specialist Note (Signed)
Integrated Behavioral Health via Telemedicine Phone Visit  07/08/2019 [facility] [id number]  Number of [place] visits: 1 Session Start time: 3:51   Session End time: 4:55 Total time: 46  Referring Provider: Verita Schneiders, MD Type of Visit: Phone Patient/Family location: Home [facility] Provider location: [facility] All persons participating in visit: Patient Katrina Ortega and [place]    Confirmed patient's address: Yes  Confirmed patient's phone number: Yes  Any changes to demographics: No   Confirmed patient's insurance: Yes  Any changes to patient's insurance: No   Discussed confidentiality: Yes   I connected with Mellissa Kohut  by a phone enabled telemedicine application and verified that I am speaking with the correct person using two identifiers.     I discussed the limitations of evaluation and management by telemedicine and the availability of in person appointments.  I discussed that the purpose of this visit is to provide behavioral health care while limiting exposure to the novel coronavirus.   Discussed there is a possibility of technology failure and discussed alternative modes of communication if that failure occurs.  I discussed that engaging in this video visit, they consent to the provision of behavioral healthcare and the services will be billed under their insurance.  Patient and/or legal guardian expressed understanding and consented to video visit: No   PRESENTING CONCERNS: Patient and/or family reports the following symptoms/concerns: Pt states her primary concern today is increase in symptoms of depression and anxiety postpartum. Pt felt better during pregnancy, as she went to bed earlier, woke up earlier, drank mostly water, but is now staying up too late, sleeping all day, overeating, and drinking soda and sweet tea. Pt also concerned that she hasn't stopped bleeding in almost 6 months, feels like "stomach is going to fall  out", and had one panic attack a couple months ago.  Duration of problem: Postpartum; Severity of problem: moderately severe  STRENGTHS (Protective Factors/Coping Skills): Good social support (mom and boyfriend)  GOALS ADDRESSED: Patient will: 1.  Reduce symptoms of: anxiety and depression  2.  Increase knowledge and/or ability of: healthy habits and self-management skills  3.  Demonstrate ability to: Increase healthy adjustment to current life circumstances  INTERVENTIONS: Interventions utilized:  Solution-Focused Strategies and Psychoeducation and/or Health Education Standardized Assessments completed: GAD-7 and PHQ 9  ASSESSMENT: Patient currently experiencing Adjustment disorder with mixed anxious and depressed mood.   Patient may benefit from psychoeducation and brief therapeutic interventions regarding coping with symptoms of anxiety and depression .  PLAN: 1. Follow up with behavioral health clinician on : Two weeks 2. Behavioral recommendations:  -Replace one soda/day with water  -Set alarm to wake up at 9am, instead of 10am  -CALM relaxation breathing exercise twice daily(morning; at bedtime) -Continue with plan to put in job applications 3. Referral(s): [place] (In Clinic)  I discussed the assessment and treatment plan with the patient and/or parent/guardian. They were provided an opportunity to ask questions and all were answered. They agreed with the plan and demonstrated an understanding of the instructions.   They were advised to call back or seek an in-person evaluation if the symptoms worsen or if the condition fails to improve as anticipated.  Caroleen Hamman Kaysey Berndt  Depression screen [facility] 2/9 07/14/2019 07/01/2019  Decreased Interest 1 2  Down, Depressed, Hopeless 0 1  PHQ - 2 Score 1 3  Altered sleeping 3 3  Tired, decreased energy 2 3  Change in appetite 3 3  Feeling bad or failure about yourself  1 1  Trouble concentrating 3 2   Moving slowly or fidgety/restless 1 1  Suicidal thoughts 0 0  PHQ-9 Score 14 16  Difficult doing work/chores - Somewhat difficult   GAD 7 : Generalized Anxiety Score 07/14/2019  Nervous, Anxious, on Edge 2  Control/stop worrying 3  Worry too much - different things 3  Trouble relaxing 2  Restless 2  Easily annoyed or irritable 2  Afraid - awful might happen 2  Total GAD 7 Score 16

## 2019-07-14 ENCOUNTER — Ambulatory Visit (INDEPENDENT_AMBULATORY_CARE_PROVIDER_SITE_OTHER): Payer: Medicaid Other | Admitting: Clinical

## 2019-07-14 ENCOUNTER — Other Ambulatory Visit: Payer: Self-pay

## 2019-07-14 DIAGNOSIS — F32A Depression, unspecified: Secondary | ICD-10-CM

## 2019-07-14 DIAGNOSIS — F329 Major depressive disorder, single episode, unspecified: Secondary | ICD-10-CM

## 2019-07-15 NOTE — BH Specialist Note (Signed)
Pt requests to reschedule, as she is currently at her 88mo son's vaccine appointment; reschedule 08/10/19 at 1:45pm  [place] via Telemedicine Video Visit  07/15/2019 Katrina Ortega [id number]  Garlan Fair

## 2019-07-19 ENCOUNTER — Ambulatory Visit: Payer: Medicaid Other | Admitting: Obstetrics and Gynecology

## 2019-07-28 ENCOUNTER — Ambulatory Visit: Payer: Medicaid Other | Admitting: Clinical

## 2019-07-28 ENCOUNTER — Other Ambulatory Visit: Payer: Self-pay

## 2019-07-30 HISTORY — PX: TOENAIL EXCISION: SHX183

## 2019-08-09 NOTE — BH Specialist Note (Signed)
Pt did not arrive to video visit and did not answer the phone ; Left HIPPA-compliant message to call back Katrina Ortega from [facility] at [phone].  ; left MyChart message for patient.    Integrated Behavioral Health via Telemedicine Video Visit  08/09/2019 Katrina Ortega [id number]  [name]

## 2019-08-10 ENCOUNTER — Ambulatory Visit: Payer: Medicaid Other | Admitting: Clinical

## 2019-08-10 DIAGNOSIS — Z5329 Procedure and treatment not carried out because of patient's decision for other reasons: Secondary | ICD-10-CM

## 2019-08-10 DIAGNOSIS — Z91199 Patient's noncompliance with other medical treatment and regimen due to unspecified reason: Secondary | ICD-10-CM

## 2019-09-02 ENCOUNTER — Ambulatory Visit: Payer: Medicaid Other | Admitting: Physician Assistant

## 2019-09-02 ENCOUNTER — Other Ambulatory Visit: Payer: Self-pay

## 2019-09-02 ENCOUNTER — Encounter: Payer: Self-pay | Admitting: Physician Assistant

## 2019-09-02 DIAGNOSIS — Z113 Encounter for screening for infections with a predominantly sexual mode of transmission: Secondary | ICD-10-CM

## 2019-09-02 DIAGNOSIS — Z1388 Encounter for screening for disorder due to exposure to contaminants: Secondary | ICD-10-CM | POA: Diagnosis not present

## 2019-09-02 DIAGNOSIS — A5901 Trichomonal vulvovaginitis: Secondary | ICD-10-CM

## 2019-09-02 DIAGNOSIS — Z3009 Encounter for other general counseling and advice on contraception: Secondary | ICD-10-CM | POA: Diagnosis not present

## 2019-09-02 DIAGNOSIS — Z0389 Encounter for observation for other suspected diseases and conditions ruled out: Secondary | ICD-10-CM | POA: Diagnosis not present

## 2019-09-02 LAB — WET PREP FOR TRICH, YEAST, CLUE
Trichomonas Exam: POSITIVE — AB
Yeast Exam: NEGATIVE

## 2019-09-02 MED ORDER — METRONIDAZOLE 500 MG PO TABS: 2000.0000 mg | ORAL_TABLET | Freq: Once | ORAL | 0 refills | Status: AC

## 2019-09-02 NOTE — Progress Notes (Signed)
[facility] Department STI clinic/screening visit  Subjective:  Katrina Ortega is a 24 y.o. female being seen today for an STI screening visit. The patient reports they do have symptoms.  Patient reports that they do not desire a pregnancy in the next year.   They reported they are not interested in discussing contraception today.  No LMP recorded. Patient has had an injection.   Patient has the following medical conditions:   Patient Active Problem List   Diagnosis Date Noted  . Abnormal Pap smear of cervix 09/16/2018  . 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 Supervision of other normal pregnancy, antepartum 08/19/2018  . Depression 08/19/2018    Chief Complaint  Patient presents with  . SEXUALLY TRANSMITTED DISEASE    HPI  Patient reports that she has had vaginal irritation and brownish discharge for 1-2 weeks.  States that she has irregular bleeding due to Depo since delivery in 12/2018.    See flowsheet for further details and programmatic requirements.    The following portions of the patient's history were reviewed and updated as appropriate: allergies, current medications, past medical history, past social history, past surgical history and problem list.  Objective:  There were no vitals filed for this visit.  Physical Exam Constitutional:      General: She is not in acute distress.    Appearance: Normal appearance. She is normal weight.  HENT:     Head: Normocephalic and atraumatic.     Comments: No nits, lice, or hair loss. No cervical, supraclavicular or axillary adenopathy.    Mouth/Throat:     Mouth: Mucous membranes are moist.     Pharynx: Oropharynx is clear. No oropharyngeal exudate or posterior oropharyngeal erythema.  Eyes:     Conjunctiva/sclera: Conjunctivae normal.  Pulmonary:     Effort: Pulmonary effort is normal.  Abdominal:     Palpations: Abdomen is soft. There is no mass.     Tenderness: There is no abdominal  tenderness. There is no guarding or rebound.  Genitourinary:    General: Normal vulva.     Rectum: Normal.     Comments: External genitalia/pubic area without nits, lice, edema, erythema, lesions and inguinal adenopathy. Vagina with normal mucosa, small amount of thin,yellow-green discharge and friable cervix. Cervix without visible lesions. Uterus firm, mobile, nt, no masses, no CMT, no adnexal tenderness or fullness. Musculoskeletal:     Cervical back: Neck supple. No tenderness.  Skin:    General: Skin is warm and dry.     Findings: No bruising, erythema, lesion or rash.  Neurological:     Mental Status: She is alert and oriented to person, place, and time.  Psychiatric:        Mood and Affect: Mood normal.        Behavior: Behavior normal.        Thought Content: Thought content normal.        Judgment: Judgment normal.      Assessment and Plan:  Katrina Ortega is a 24 y.o. female presenting to the [facility] Department for STI screening  1. Screening for STD (sexually transmitted disease) Patient into clinic with symptoms. Rec condoms with all sex. Await test results.  Counseled that RN will call if needs to RTC for further treatment once results are back. - WET PREP FOR TRICH, YEAST, CLUE - Chlamydia/Gonorrhea [place] - HIV/HCV [place] [facility] - Syphilis Serology, [place] [facility]  2. Trichomonal vaginitis Will treat with  Metronidazole 2 g po at one time with food, no EtOH for 24 hr before and until 72 hr after completing medicine. No sex for 7 days and until after partner completes treatment. Rec using OTC antifungal cream if has itching during or just after treatment with antibiotic. - metroNIDAZOLE (FLAGYL) 500 MG tablet; Take 4 tablets (2,000 mg total) by mouth once for 1 dose.  Dispense: 4 tablet; Refill: 0     No follow-ups on file.  Future Appointments  Date Time Provider [place]  09/06/2019  2:00 PM [place], [place] [id number]  [facility]  09/16/2019  2:00 PM [place], [place]

## 2019-09-06 ENCOUNTER — Ambulatory Visit: Payer: Medicaid Other | Admitting: Podiatry

## 2019-09-06 ENCOUNTER — Other Ambulatory Visit: Payer: Self-pay

## 2019-09-06 DIAGNOSIS — L600 Ingrowing nail: Secondary | ICD-10-CM | POA: Diagnosis not present

## 2019-09-06 NOTE — Patient Instructions (Addendum)
Pre-Operative Instructions  Congratulations, you have decided to take an important step towards improving your quality of life.  You can be assured that the doctors and staff at [facility] will be with you every step of the way.  Here are some important things you should know:  1. Plan to be at the surgery center/hospital at least 1 (one) hour prior to your scheduled time, unless otherwise directed by the surgical center/hospital staff.  You must have a responsible adult accompany you, remain during the surgery and drive you home.  Make sure you have directions to the surgical center/hospital to ensure you arrive on time. 2. If you are having surgery at [facility] or [facility], you will need a copy of your medical history and physical form from your family physician within one month prior to the date of surgery. We will give you a form for your primary physician to complete.  3. We make every effort to accommodate the date you request for surgery.  However, there are times where surgery dates or times have to be moved.  We will contact you as soon as possible if a change in schedule is required.   4. No aspirin/ibuprofen for one week before surgery.  If you are on aspirin, any non-steroidal anti-inflammatory medications (Mobic, Aleve, Ibuprofen) should not be taken seven (7) days prior to your surgery.  You make take Tylenol for pain prior to surgery.  5. Medications - If you are taking daily heart and blood pressure medications, seizure, reflux, allergy, asthma, anxiety, pain or diabetes medications, make sure you notify the surgery center/hospital before the day of surgery so they can tell you which medications you should take or avoid the day of surgery. 6. No food or drink after midnight the night before surgery unless directed otherwise by surgical center/hospital staff. 7. No alcoholic beverages 24-hours prior to surgery.  No smoking 24-hours prior or 24-hours after  surgery. 8. Wear loose pants or shorts. They should be loose enough to fit over bandages, boots, and casts. 9. Don't wear slip-on shoes. Sneakers are preferred. 10. Bring your boot with you to the surgery center/hospital.  Also bring crutches or a walker if your physician has prescribed it for you.  If you do not have this equipment, it will be provided for you after surgery. 11. If you have not been contacted by the surgery center/hospital by the day before your surgery, call to confirm the date and time of your surgery. 12. Leave-time from work may vary depending on the type of surgery you have.  Appropriate arrangements should be made prior to surgery with your employer. 13. Prescriptions will be provided immediately following surgery by your doctor.  Fill these as soon as possible after surgery and take the medication as directed. Pain medications will not be refilled on weekends and must be approved by the doctor. 14. Remove nail polish on the operative foot and avoid getting pedicures prior to surgery. 15. Wash the night before surgery.  The night before surgery wash the foot and leg well with water and the antibacterial soap provided. Be sure to pay special attention to beneath the toenails and in between the toes.  Wash for at least three (3) minutes. Rinse thoroughly with water and dry well with a towel.  Perform this wash unless told not to do so by your physician.  Enclosed: 1 Ice pack (please put in freezer the night before surgery)   1 Hibiclens skin cleaner     Pre-op instructions  If you have any questions regarding the instructions, please do not hesitate to call our office.  [place]: [address], [place], [place] [address] -- [phone]  [place]: [address], [place], [place] [address] -- [phone]  [place]: [address], [place], [place] [address] -- [phone]   Website: [email or web address] Soak Instructions    THE DAY AFTER THE  PROCEDURE  Place 1/4 cup of epsom salts in a quart of warm tap water.  Submerge your foot or feet with outer bandage intact for the initial soak; this will allow the bandage to become moist and wet for easy lift off.  Once you remove your bandage, continue to soak in the solution for 20 minutes.  This soak should be done twice a day.  Next, remove your foot or feet from solution, blot dry the affected area and cover.  You may use a band aid large enough to cover the area or use gauze and tape.  Apply other medications to the area as directed by the doctor such as polysporin neosporin.  IF YOUR SKIN BECOMES IRRITATED WHILE USING THESE INSTRUCTIONS, IT IS OKAY TO SWITCH TO  WHITE VINEGAR AND WATER. Or you may use antibacterial soap and water to keep the toe clean  Monitor for any signs/symptoms of infection. Call the office immediately if any occur or go directly to the emergency room. Call with any questions/concerns.    Long Term Care Instructions-Post Nail Surgery  You have had your ingrown toenail and root treated with a chemical.  This chemical causes a burn that will drain and ooze like a blister.  This can drain for 6-8 weeks or longer.  It is important to keep this area clean, covered, and follow the soaking instructions dispensed at the time of your surgery.  This area will eventually dry and form a scab.  Once the scab forms you no longer need to soak or apply a dressing.  If at any time you experience an increase in pain, redness, swelling, or drainage, you should contact the office as soon as possible.

## 2019-09-07 ENCOUNTER — Encounter: Payer: Self-pay | Admitting: Podiatry

## 2019-09-07 NOTE — Progress Notes (Signed)
Subjective:  Patient ID: Katrina Ortega, female    DOB: [birth year],  MRN: [id number]  Chief Complaint  Patient presents with  . Ingrown Toenail    pt is here for a possible ingrown of the right big toenail medial side, pain has been going on for about 4 months, pt states that it is painful to the touch. PT also has another possible ingrown of the left big toenail medial side.    24 y.o. female presents with the above complaint.  Patient presents with bilateral hallux ingrown toenails that has been causing her a lot of pain.  Patient states that this is been going for about 4 months of progressive gotten worse.  Patient states it is painful to touch now there is shooting sensation associated with it.  There is also some swelling and oozing.  Patient has tried antibiotic ointment which has not helped.  She also states that she is very anxious and very scared of needles and would not like to have a procedure done in office.  She would prefer to be done at a surgical center.   Review of Systems: Negative except as noted in the HPI. Denies N/V/F/Ch.  Past Medical History:  Diagnosis Date  . Anemia   . Group beta Strep positive 08/24/2018   Identified in urine 01/22 Will require prophylaxis in labor  . Medical history non-contributory   . Supervision of other normal pregnancy, antepartum 08/19/2018    Nursing Staff Provider Office Location  [place] Dating   LMP Language   ENG Anatomy US  Normal Flu Vaccine  Declined Genetic Screen  Declined  TDaP vaccine   11/04/2018 Hgb A1C or  GTT Early  Third trimester  Rhogam   N/A: A POS   LAB RESULTS  Feeding Plan  breast Blood Type A/Positive/-- (01/22 1555)  Contraception  Depo Antibody Negative (01/22 1555) Circumcision Yes Rubella 3.68 (01/22 1555) Pediatri    Current Outpatient Medications:  .  acetaminophen (TYLENOL) 500 MG tablet, Take 2 tablets (1,000 mg total) by mouth every 6 (six) hours as needed for mild pain or moderate pain., Disp: 30 tablet, Rfl:  0 .  cetirizine (ZYRTEC ALLERGY) 10 MG tablet, Take 1 tablet (10 mg total) by mouth daily., Disp: 30 tablet, Rfl: 2 .  medroxyPROGESTERone (DEPO-PROVERA) 150 MG/ML injection, Inject 1 mL (150 mg total) into the muscle every 3 (three) months., Disp: 1 mL, Rfl: 0 .  miconazole (MONISTAT 7) 2 % vaginal cream, Place 1 Applicatorful vaginally at bedtime. Apply for seven nights, Disp: 30 g, Rfl: 0 .  pantoprazole (PROTONIX) 20 MG tablet, TAKE 1 TABLET BY MOUTH EVERY DAY, Disp: 30 tablet, Rfl: 1 .  phenylephrine-shark liver oil-mineral oil-petrolatum (PREPARATION H) 0.25-3-14-71.9 % rectal ointment, Place 1 application rectally 2 (two) times daily as needed for hemorrhoids., Disp: , Rfl:  .  Prenatal Vit-Fe Fumarate-FA (PRENATAL MULTIVITAMIN) TABS tablet, Take 1 tablet by mouth daily at 12 noon., Disp: , Rfl:  .  senna-docusate (SENOKOT-S) 8.6-50 MG tablet, Take 2 tablets by mouth at bedtime as needed for mild constipation., Disp: , Rfl:   Social History   Tobacco Use  Smoking Status Former Smoker  Smokeless Tobacco Never Used    Allergies  Allergen Reactions  . Percocet [Oxycodone-Acetaminophen] Itching   Objective:  There were no vitals filed for this visit. There is no height or weight on file to calculate BMI. Constitutional Well developed. Well nourished.  Vascular Dorsalis pedis pulses palpable bilaterally. Posterior tibial pulses palpable bilaterally.  Capillary refill normal to all digits.  No cyanosis or clubbing noted. Pedal hair growth normal.  Neurologic Normal speech. Oriented to person, place, and time. Epicritic sensation to light touch grossly present bilaterally.  Dermatologic Painful ingrowing nail at Right hallux lateral border and left hallux medial border nail borders of the hallux nail bilaterally. No other open wounds. No skin lesions.  Orthopedic: Normal joint ROM without pain or crepitus bilaterally. No visible deformities. No bony tenderness.   Radiographs:  None Assessment:  No diagnosis found. Plan:  Patient was evaluated and treated and all questions answered.  Ingrown Nail, bilaterally right hallux lateral border left hallux medial border  -Given the patient is very anxious and is very afraid of needles I believe patient will benefit from undergoing this procedure at the surgery center.  I explained to the patient that generally this procedure is done in office however in this particular situation patient will benefit from propofol for likely sedation with removal of ingrown nail borders of both hallux. -I plan on performing right hallux lateral border ingrown with matricectomy and left hallux medial border with matricectomy -Informed surgical risk consent was reviewed and read aloud to the patient.  I reviewed the films.  I have discussed my findings with the patient in great detail.  I have discussed all risks including but not limited to infection, stiffness, scarring, limp, disability, deformity, damage to blood vessels and nerves, numbness, poor healing, need for braces, arthritis, chronic pain, amputation, death.  All benefits and realistic expectations discussed in great detail.  I have made no promises as to the outcome.  I have provided realistic expectations.  I have offered the patient a 2nd opinion, which they have declined and assured me they preferred to proceed despite the risks     No follow-ups on file.

## 2019-09-09 ENCOUNTER — Telehealth: Payer: Self-pay | Admitting: Radiology

## 2019-09-09 LAB — HM HIV SCREENING LAB: HM HIV Screening: NEGATIVE

## 2019-09-09 LAB — HM HEPATITIS C SCREENING LAB: HM Hepatitis Screen: NEGATIVE

## 2019-09-09 NOTE — Telephone Encounter (Signed)
Left message to move depo injection to morning appointment 09/16/19. Office will be closed for class

## 2019-09-16 ENCOUNTER — Ambulatory Visit: Payer: Medicaid Other

## 2019-09-20 DIAGNOSIS — M25571 Pain in right ankle and joints of right foot: Secondary | ICD-10-CM | POA: Diagnosis not present

## 2019-09-20 DIAGNOSIS — L600 Ingrowing nail: Secondary | ICD-10-CM | POA: Diagnosis not present

## 2019-09-21 ENCOUNTER — Ambulatory Visit: Payer: Medicaid Other

## 2019-09-26 NOTE — Progress Notes (Signed)
Chart reviewed by Pharmacist  Suzanne Walker PharmD, Contract Pharmacist at [facility]  

## 2019-09-28 ENCOUNTER — Encounter: Payer: Medicaid Other | Admitting: Podiatry

## 2019-10-04 ENCOUNTER — Encounter: Payer: Self-pay | Admitting: Podiatry

## 2019-10-04 ENCOUNTER — Ambulatory Visit (INDEPENDENT_AMBULATORY_CARE_PROVIDER_SITE_OTHER): Payer: Medicaid Other | Admitting: Podiatry

## 2019-10-04 ENCOUNTER — Other Ambulatory Visit: Payer: Self-pay

## 2019-10-04 DIAGNOSIS — L600 Ingrowing nail: Secondary | ICD-10-CM

## 2019-10-05 ENCOUNTER — Encounter: Payer: Self-pay | Admitting: Podiatry

## 2019-10-05 NOTE — Progress Notes (Signed)
Subjective: [name] is a 24 y.o.  female returns to office today for follow up evaluation after having bilateral, right hallux lateral border and left hallux medial border border nail avulsion performed. Patient has been soaking using epsom salt and applying topical antibiotic covered with bandaid daily. Patient denies fevers, chills, nausea, vomiting. Denies any calf pain, chest pain, SOB.   Objective:  Vitals: Reviewed  General: Well developed, nourished, in no acute distress, alert and oriented x3   Dermatology: Skin is warm, dry and supple bilateral.  Right hallux lateral border left hallux medial border hallux nail border appears to be clean, dry, with mild granular tissue and surrounding scab. There is no surrounding erythema, edema, drainage/purulence. The remaining nails appear unremarkable at this time. There are no other lesions or other signs of infection present.  Neurovascular status: Intact. No lower extremity swelling; No pain with calf compression bilateral.  Musculoskeletal: Decreased tenderness to palpation of the right hallux lateral border left hallux medial border hallux nail fold(s). Muscular strength within normal limits bilateral.   Assesement and Plan: S/p partial nail avulsion, doing well.   -Continue soaking in epsom salts twice a day followed by antibiotic ointment and a band-aid. Can leave uncovered at night. Continue this until completely healed.  -If the area has not healed in 2 weeks, call the office for follow-up appointment, or sooner if any problems arise.  -Monitor for any signs/symptoms of infection. Call the office immediately if any occur or go directly to the emergency room. Call with any questions/concerns.  Nicholes Rough, DPM

## 2019-10-13 ENCOUNTER — Ambulatory Visit: Payer: Medicaid Other

## 2019-10-13 ENCOUNTER — Other Ambulatory Visit: Payer: Self-pay | Admitting: *Deleted

## 2019-10-13 MED ORDER — MEDROXYPROGESTERONE ACETATE 150 MG/ML IM SUSP: 150.0000 mg | INTRAMUSCULAR | 2 refills | Status: DC

## 2019-10-14 ENCOUNTER — Ambulatory Visit: Payer: Medicaid Other

## 2019-10-18 ENCOUNTER — Ambulatory Visit (INDEPENDENT_AMBULATORY_CARE_PROVIDER_SITE_OTHER): Payer: Medicaid Other

## 2019-10-18 ENCOUNTER — Other Ambulatory Visit: Payer: Self-pay

## 2019-10-18 DIAGNOSIS — Z3202 Encounter for pregnancy test, result negative: Secondary | ICD-10-CM | POA: Diagnosis not present

## 2019-10-18 DIAGNOSIS — Z3042 Encounter for surveillance of injectable contraceptive: Secondary | ICD-10-CM

## 2019-10-18 NOTE — Progress Notes (Signed)
Patient presented to the office today for her depo-provera injection. She miss her the window allowed  for her last injection and she was previously told she could get her depo-provera today if she took a pregnancy test and it was negative. After talking with patient she has been sexually active over the weekend unprotected. Her pregnancy test is negative in the office today, however she will need to wait another two weeks before we can administer her injection. Patient voice understanding however she was a little upset.

## 2019-10-19 LAB — POCT URINE PREGNANCY: Preg Test, Ur: NEGATIVE

## 2019-10-27 ENCOUNTER — Other Ambulatory Visit: Payer: Self-pay

## 2019-10-27 ENCOUNTER — Telehealth: Payer: Self-pay | Admitting: *Deleted

## 2019-10-27 ENCOUNTER — Ambulatory Visit (INDEPENDENT_AMBULATORY_CARE_PROVIDER_SITE_OTHER): Payer: Medicaid Other

## 2019-10-27 VITALS — BP 117/86 | HR 85 | Temp 97.1°F | Wt 147.0 lb

## 2019-10-27 DIAGNOSIS — Z3042 Encounter for surveillance of injectable contraceptive: Secondary | ICD-10-CM

## 2019-10-27 DIAGNOSIS — Z3202 Encounter for pregnancy test, result negative: Secondary | ICD-10-CM

## 2019-10-27 LAB — POCT URINE PREGNANCY: Preg Test, Ur: NEGATIVE

## 2019-10-27 MED ORDER — MEDROXYPROGESTERONE ACETATE 150 MG/ML IM SUSP
150.0000 mg | Freq: Once | INTRAMUSCULAR | Status: AC
  Administered 2019-10-27: 09:00:00 150 mg via INTRAMUSCULAR

## 2019-10-27 NOTE — Telephone Encounter (Signed)
Pt called stating she started her cycle 2 days ago and wanted to know if she could come in today to get depo. Per Dr Shawnie Pons, if UPT is negative today in office and with pt being on her cycle, we can give depo injection today. Pt informed and will come in later today.

## 2019-10-27 NOTE — Progress Notes (Signed)
Patient presented to the office today for her depo-provera injection given in the left arm IM 150 mg. Patient miss her last appointment so she we will get a pregnancy test. Upt is negative so I will go ahead and administer the depo-provera today.  Depo-Provera: 150 mg  Given by: D.Cheree Ditto HCG: Negative Patient to return to clinic around 06/16/-01/26/2020

## 2019-11-01 ENCOUNTER — Ambulatory Visit: Payer: Medicaid Other

## 2019-11-01 ENCOUNTER — Encounter: Payer: Medicaid Other | Admitting: Podiatry

## 2019-11-01 DIAGNOSIS — Z3042 Encounter for surveillance of injectable contraceptive: Secondary | ICD-10-CM | POA: Insufficient documentation

## 2019-11-26 NOTE — Telephone Encounter (Signed)
error 

## 2019-12-08 ENCOUNTER — Ambulatory Visit: Payer: Medicaid Other

## 2019-12-17 ENCOUNTER — Other Ambulatory Visit: Payer: Self-pay

## 2019-12-17 ENCOUNTER — Ambulatory Visit: Payer: Medicaid Other | Admitting: Physician Assistant

## 2019-12-17 ENCOUNTER — Encounter: Payer: Self-pay | Admitting: Physician Assistant

## 2019-12-17 DIAGNOSIS — Z0389 Encounter for observation for other suspected diseases and conditions ruled out: Secondary | ICD-10-CM | POA: Diagnosis not present

## 2019-12-17 DIAGNOSIS — Z3009 Encounter for other general counseling and advice on contraception: Secondary | ICD-10-CM | POA: Diagnosis not present

## 2019-12-17 DIAGNOSIS — Z113 Encounter for screening for infections with a predominantly sexual mode of transmission: Secondary | ICD-10-CM | POA: Diagnosis not present

## 2019-12-17 DIAGNOSIS — Z1388 Encounter for screening for disorder due to exposure to contaminants: Secondary | ICD-10-CM | POA: Diagnosis not present

## 2019-12-17 LAB — WET PREP FOR TRICH, YEAST, CLUE
Trichomonas Exam: NEGATIVE
Yeast Exam: NEGATIVE

## 2019-12-17 NOTE — Progress Notes (Signed)
  [facility] Department STI clinic/screening visit  Subjective:  Katrina Ortega is a 24 y.o. female being seen today for an STI screening visit. The patient reports they do have symptoms.  Patient reports that they do not desire a pregnancy in the next year.   They reported they are not interested in discussing contraception today.  No LMP recorded. Patient has had an injection.   Patient has the following medical conditions:   Patient Active Problem List   Diagnosis Date Noted  . Depo-Provera contraceptive status 11/01/2019  . Abnormal Pap smear of cervix 09/16/2018  . 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 Depression 08/19/2018    Chief Complaint  Patient presents with  . SEXUALLY TRANSMITTED DISEASE    screening    HPI  Patient reports that she has noticed a yellowish discharge and cramping off and on for about 2 weeks.  Denies other symptoms.  Reports that she gets Depo for [facility] and last shot was given 10/27/2019.  States has occasional spotting with Depo.  See flowsheet for further details and programmatic requirements.    The following portions of the patient's history were reviewed and updated as appropriate: allergies, current medications, past medical history, past social history, past surgical history and problem list.  Objective:  There were no vitals filed for this visit.  Physical Exam Constitutional:      General: She is not in acute distress.    Appearance: Normal appearance.  HENT:     Head: Normocephalic and atraumatic.     Comments: No nits, lice, or hair loss. No cervical, supraclavicular or axillary adenopathy.    Mouth/Throat:     Mouth: Mucous membranes are moist.     Pharynx: Oropharynx is clear. No oropharyngeal exudate or posterior oropharyngeal erythema.  Eyes:     Conjunctiva/sclera: Conjunctivae normal.  Pulmonary:     Effort: Pulmonary effort is normal.  Abdominal:     Palpations: Abdomen is soft. There is no mass.      Tenderness: There is no abdominal tenderness. There is no guarding or rebound.  Genitourinary:    General: Normal vulva.     Rectum: Normal.  Musculoskeletal:     Cervical back: Neck supple. No tenderness.  Skin:    General: Skin is warm and dry.     Findings: No bruising, erythema, lesion or rash.  Neurological:     Mental Status: She is alert and oriented to person, place, and time.  Psychiatric:        Mood and Affect: Mood normal.        Behavior: Behavior normal.        Thought Content: Thought content normal.        Judgment: Judgment normal.      Assessment and Plan:  Katrina Ortega is a 25 y.o. female presenting to the [facility] Department for STI screening  1. Screening for STD (sexually transmitted disease) Patient into clinic with symptoms. Rec condoms with all sex. Reviewed with patient that exam and wet mount are normal. Await test results. Counseled that RN will call if needs to RTC for treatment once results are back.  - WET PREP FOR TRICH, YEAST, CLUE - Gonococcus culture - Chlamydia/Gonorrhea [place] [facility] - HIV [facility] - Syphilis Serology, [place] [facility]     No follow-ups on file.  No future appointments.  Matt Holmes, PA

## 2019-12-21 LAB — GONOCOCCUS CULTURE

## 2020-01-17 ENCOUNTER — Ambulatory Visit: Payer: Medicaid Other

## 2020-01-18 ENCOUNTER — Other Ambulatory Visit: Payer: Self-pay

## 2020-01-18 ENCOUNTER — Ambulatory Visit (INDEPENDENT_AMBULATORY_CARE_PROVIDER_SITE_OTHER): Payer: Medicaid Other | Admitting: *Deleted

## 2020-01-18 VITALS — BP 114/74 | HR 69

## 2020-01-18 DIAGNOSIS — Z3042 Encounter for surveillance of injectable contraceptive: Secondary | ICD-10-CM

## 2020-01-18 MED ORDER — MEDROXYPROGESTERONE ACETATE 150 MG/ML IM SUSP
150.0000 mg | Freq: Once | INTRAMUSCULAR | Status: AC
  Administered 2020-01-18: 150 mg via INTRAMUSCULAR

## 2020-01-18 NOTE — Progress Notes (Signed)
Date last pap: 08/19/2018. Last Depo-Provera: 10/27/2019. Side Effects if any: none. Serum HCG indicated? na. Depo-Provera 150 mg IM given by: Scheryl Marten, RN. Next appointment due Sept 7-21st..

## 2020-01-18 NOTE — Progress Notes (Signed)
Patient was assessed and managed by nursing staff during this encounter. I have reviewed the chart and agree with the documentation and plan. I have also made any necessary editorial changes.  Jaynie Collins, MD 01/18/2020 2:21 PM

## 2020-02-16 ENCOUNTER — Ambulatory Visit (INDEPENDENT_AMBULATORY_CARE_PROVIDER_SITE_OTHER): Payer: Medicaid Other | Admitting: Advanced Practice Midwife

## 2020-02-16 ENCOUNTER — Other Ambulatory Visit (HOSPITAL_COMMUNITY)
Admission: RE | Admit: 2020-02-16 | Discharge: 2020-02-16 | Disposition: A | Discharge status: Home / Self Care | Payer: Medicaid Other | Source: Ambulatory Visit | Attending: Advanced Practice Midwife | Admitting: Advanced Practice Midwife

## 2020-02-16 ENCOUNTER — Encounter: Payer: Self-pay | Admitting: Advanced Practice Midwife

## 2020-02-16 ENCOUNTER — Other Ambulatory Visit: Payer: Self-pay

## 2020-02-16 VITALS — BP 125/85 | HR 69 | Wt 166.0 lb

## 2020-02-16 DIAGNOSIS — Z01419 Encounter for gynecological examination (general) (routine) without abnormal findings: Secondary | ICD-10-CM | POA: Insufficient documentation

## 2020-02-16 DIAGNOSIS — Z113 Encounter for screening for infections with a predominantly sexual mode of transmission: Secondary | ICD-10-CM | POA: Diagnosis not present

## 2020-02-16 LAB — POCT URINALYSIS DIPSTICK
Blood, UA: NEGATIVE
Leukocytes, UA: NEGATIVE

## 2020-02-17 LAB — CBC
Hematocrit: 37.7 % (ref 34.0–46.6)
Hemoglobin: 13.3 g/dL (ref 11.1–15.9)
MCH: 30.5 pg (ref 26.6–33.0)
MCHC: 35.3 g/dL (ref 31.5–35.7)
MCV: 87 fL (ref 79–97)
Platelets: 248 10*3/uL (ref 150–450)
RBC: 4.36 x10E6/uL (ref 3.77–5.28)
RDW: 13.5 % (ref 11.7–15.4)
WBC: 6.7 10*3/uL (ref 3.4–10.8)

## 2020-02-17 LAB — SYPHILIS: RPR W/REFLEX TO RPR TITER AND TREPONEMAL ANTIBODIES, TRADITIONAL SCREENING AND DIAGNOSIS ALGORITHM: RPR Ser Ql: NONREACTIVE

## 2020-02-17 LAB — BASIC METABOLIC PANEL
BUN/Creatinine Ratio: 13 (ref 9–23)
BUN: 10 mg/dL (ref 6–20)
CO2: 22 mmol/L (ref 20–29)
Calcium: 9.6 mg/dL (ref 8.7–10.2)
Chloride: 106 mmol/L (ref 96–106)
Creatinine, Ser: 0.79 mg/dL (ref 0.57–1.00)
GFR calc Af Amer: 120 mL/min/{1.73_m2} (ref >59)
GFR calc non Af Amer: 104 mL/min/{1.73_m2} (ref >59)
Glucose: 86 mg/dL (ref 65–99)
Potassium: 3.9 mmol/L (ref 3.5–5.2)
Sodium: 140 mmol/L (ref 134–144)

## 2020-02-17 LAB — HEMOGLOBIN A1C
Est. average glucose Bld gHb Est-mCnc: 111 mg/dL
Hgb A1c MFr Bld: 5.5 % (ref 4.8–5.6)

## 2020-02-17 LAB — HEPATITIS B SURFACE ANTIGEN: Hepatitis B Surface Ag: NEGATIVE

## 2020-02-17 LAB — HEPATITIS C ANTIBODY: Hep C Virus Ab: <0.1 {s_co_ratio} (ref 0.0–0.9)

## 2020-02-17 LAB — TSH: TSH: 1.9 u[IU]/mL (ref 0.450–4.500)

## 2020-02-17 LAB — HIV ANTIBODY (ROUTINE TESTING W REFLEX): HIV Screen 4th Generation wRfx: NONREACTIVE

## 2020-02-17 NOTE — Progress Notes (Signed)
GYNECOLOGY ANNUAL PREVENTATIVE CARE ENCOUNTER NOTE  History:     Katrina Ortega is a 24 y.o. G20P1001 female here for a routine annual gynecologic exam.  Current complaints: none. Patient requests STI screening.   Denies abnormal vaginal bleeding, discharge, pelvic pain, problems with intercourse or other gynecologic concerns.    Gynecologic History No LMP recorded. Patient has had an injection. Contraception: Depo-Provera injections Last Pap: 08/19/2018. Results were: LSIL with positive HPV   Obstetric History OB History  Gravida Para Term Preterm AB Living  1 1 1  0 0 1  SAB TAB Ectopic Multiple Live Births  0 0 0 0 1    # Outcome Date GA Lbr Len/2nd Weight Sex Delivery Anes PTL Lv  1 Term 01/22/19 [redacted]w[redacted]d  6 lb 12.5 oz (3.075 kg) M CS-LTranv EPI  LIV    Past Medical History:  Diagnosis Date  . Anemia   . Group beta Strep positive 08/24/2018   Identified in urine 01/22 Will require prophylaxis in labor  . Medical history non-contributory   . Supervision of other normal pregnancy, antepartum 08/19/2018    Nursing Staff Provider Office Location  [facility] Dating   LMP Language   ENG Anatomy 08/21/2018  Normal Flu Vaccine  Declined Genetic Screen  Declined  TDaP vaccine   11/04/2018 Hgb A1C or  GTT Early  Third trimester  Rhogam   N/A: A POS   LAB RESULTS  Feeding Plan  breast Blood Type A/Positive/-- (01/22 1555)  Contraception  Depo Antibody Negative (01/22 1555) Circumcision Yes Rubella 3.68 (01/22 1555) Pediatri    Past Surgical History:  Procedure Laterality Date  . CESAREAN SECTION N/A 01/22/2019   Procedure: CESAREAN SECTION;  Surgeon: 01/24/2019, MD;  Location: [facility] LD [facility]S;  Service: Obstetrics;  Laterality: N/A;  . NO PAST SURGERIES      Current Outpatient Medications on File Prior to Visit  Medication Sig Dispense Refill  . cetirizine (ZYRTEC ALLERGY) 10 MG tablet Take 1 tablet (10 mg total) by mouth daily. 30 tablet 2  . medroxyPROGESTERone (DEPO-PROVERA) 150 MG/ML injection  Inject 1 mL (150 mg total) into the muscle every 3 (three) months. 1 mL 2  . acetaminophen (TYLENOL) 500 MG tablet Take 2 tablets (1,000 mg total) by mouth every 6 (six) hours as needed for mild pain or moderate pain. (Patient not taking: Reported on 02/16/2020) 30 tablet 0  . miconazole (MONISTAT 7) 2 % vaginal cream Place 1 Applicatorful vaginally at bedtime. Apply for seven nights (Patient not taking: Reported on 02/16/2020) 30 g 0  . pantoprazole (PROTONIX) 20 MG tablet TAKE 1 TABLET BY MOUTH EVERY DAY (Patient not taking: Reported on 02/16/2020) 30 tablet 1  . phenylephrine-shark liver oil-mineral oil-petrolatum (PREPARATION H) 0.25-3-14-71.9 % rectal ointment Place 1 application rectally 2 (two) times daily as needed for hemorrhoids. (Patient not taking: Reported on 02/16/2020)    . Prenatal Vit-Fe Fumarate-FA (PRENATAL MULTIVITAMIN) TABS tablet Take 1 tablet by mouth daily at 12 noon. (Patient not taking: Reported on 02/16/2020)    . senna-docusate (SENOKOT-S) 8.6-50 MG tablet Take 2 tablets by mouth at bedtime as needed for mild constipation. (Patient not taking: Reported on 02/16/2020)     No current facility-administered medications on file prior to visit.    Allergies  Allergen Reactions  . Percocet [Oxycodone-Acetaminophen] Itching    Social History:  reports that she has quit smoking. She has never used smokeless tobacco. She reports current drug use. Drug: Marijuana. She reports that she does  not drink alcohol.  Family History  Problem Relation Age of Onset  . Multiple sclerosis Mother   . Migraines Mother   . Hypertension Father   . Migraines Father   . Diabetes Maternal Uncle   . Diabetes Maternal Grandfather   . Diabetes Paternal Grandmother   . Hypertension Paternal Grandmother   . Schizophrenia Maternal Grandmother   . Breast cancer Maternal Grandmother     The following portions of the patient's history were reviewed and updated as appropriate: allergies, current  medications, past family history, past medical history, past social history, past surgical history and problem list.  Review of Systems Pertinent items noted in HPI and remainder of comprehensive ROS otherwise negative.  Physical Exam:  BP 125/85   Pulse 69   Wt 166 lb (75.3 kg)   BMI 26.00 kg/m  CONSTITUTIONAL: Well-developed, well-nourished female in no acute distress.  HENT:  Normocephalic, atraumatic, External right and left ear normal. Oropharynx is clear and moist EYES: Conjunctivae and EOM are normal. Pupils are equal, round, and reactive to light. No scleral icterus.  NECK: Normal range of motion, supple, no masses.  Normal thyroid.  SKIN: Skin is warm and dry. No rash noted. Not diaphoretic. No erythema. No pallor. MUSCULOSKELETAL: Normal range of motion. No tenderness.  No cyanosis, clubbing, or edema.  2+ distal pulses. NEUROLOGIC: Alert and oriented to person, place, and time. Normal reflexes, muscle tone coordination.  PSYCHIATRIC: Normal mood and affect. Normal behavior. Normal judgment and thought content. CARDIOVASCULAR: Normal heart rate noted, regular rhythm RESPIRATORY: Clear to auscultation bilaterally. Effort and breath sounds normal, no problems with respiration noted. BREASTS: Symmetric in size. No masses, tenderness, skin changes, nipple drainage, or lymphadenopathy bilaterally. Performed in the presence of a chaperone. ABDOMEN: Soft, no distention noted.  No tenderness, rebound or guarding.  PELVIC: Normal appearing external genitalia and urethral meatus; normal appearing vaginal mucosa and cervix.  No abnormal discharge noted.  Pap smear obtained.  Normal uterine size, no other palpable masses, no uterine or adnexal tenderness.  Performed in the presence of a chaperone.   Assessment and Plan:    1. Well woman exam with routine gynecological exam - No concerning findings on physical exam - Content with Depo for bc - Cytology - PAP - CBC - Hemoglobin A1c -  TSH - Basic metabolic panel - POCT Urinalysis Dipstick  2. Routine screening for STI (sexually transmitted infection)  - Hepatitis B surface antigen - Hepatitis C antibody - HIV Antibody (routine testing w rflx) - RPR  Will follow up results of pap smear and manage accordingly. Routine preventative health maintenance measures emphasized. Please refer to After Visit Summary for other counseling recommendations.      Clayton Bibles, MSN, CNM Certified Nurse Midwife, [facility] for [employer], [facility] 02/17/20 9:37 AM

## 2020-02-17 NOTE — Patient Instructions (Signed)
Preventive Care 21-25 Years Old, Female Preventive care refers to visits with your health care provider and lifestyle choices that can promote health and wellness. This includes:  A yearly physical exam. This may also be called an annual well check.  Regular dental visits and eye exams.  Immunizations.  Screening for certain conditions.  Healthy lifestyle choices, such as eating a healthy diet, getting regular exercise, not using drugs or products that contain nicotine and tobacco, and limiting alcohol use. What can I expect for my preventive care visit? Physical exam Your health care provider will check your:  Height and weight. This may be used to calculate body mass index (BMI), which tells if you are at a healthy weight.  Heart rate and blood pressure.  Skin for abnormal spots. Counseling Your health care provider may ask you questions about your:  Alcohol, tobacco, and drug use.  Emotional well-being.  Home and relationship well-being.  Sexual activity.  Eating habits.  Work and work environment.  Method of birth control.  Menstrual cycle.  Pregnancy history. What immunizations do I need?  Influenza (flu) vaccine  This is recommended every year. Tetanus, diphtheria, and pertussis (Tdap) vaccine  You may need a Td booster every 10 years. Varicella (chickenpox) vaccine  You may need this if you have not been vaccinated. Human papillomavirus (HPV) vaccine  If recommended by your health care provider, you may need three doses over 6 months. Measles, mumps, and rubella (MMR) vaccine  You may need at least one dose of MMR. You may also need a second dose. Meningococcal conjugate (MenACWY) vaccine  One dose is recommended if you are age 19-21 years and a first-year college student living in a residence hall, or if you have one of several medical conditions. You may also need additional booster doses. Pneumococcal conjugate (PCV13) vaccine  You may need  this if you have certain conditions and were not previously vaccinated. Pneumococcal polysaccharide (PPSV23) vaccine  You may need one or two doses if you smoke cigarettes or if you have certain conditions. Hepatitis A vaccine  You may need this if you have certain conditions or if you travel or work in places where you may be exposed to hepatitis A. Hepatitis B vaccine  You may need this if you have certain conditions or if you travel or work in places where you may be exposed to hepatitis B. Haemophilus influenzae type b (Hib) vaccine  You may need this if you have certain conditions. You may receive vaccines as individual doses or as more than one vaccine together in one shot (combination vaccines). Talk with your health care provider about the risks and benefits of combination vaccines. What tests do I need?  Blood tests  Lipid and cholesterol levels. These may be checked every 5 years starting at age 20.  Hepatitis C test.  Hepatitis B test. Screening  Diabetes screening. This is done by checking your blood sugar (glucose) after you have not eaten for a while (fasting).  Sexually transmitted disease (STD) testing.  BRCA-related cancer screening. This may be done if you have a family history of breast, ovarian, tubal, or peritoneal cancers.  Pelvic exam and Pap test. This may be done every 3 years starting at age 21. Starting at age 30, this may be done every 5 years if you have a Pap test in combination with an HPV test. Talk with your health care provider about your test results, treatment options, and if necessary, the need for more tests.   Follow these instructions at home: Eating and drinking   Eat a diet that includes fresh fruits and vegetables, whole grains, lean protein, and low-fat dairy.  Take vitamin and mineral supplements as recommended by your health care provider.  Do not drink alcohol if: ? Your health care provider tells you not to drink. ? You are  pregnant, may be pregnant, or are planning to become pregnant.  If you drink alcohol: ? Limit how much you have to 0-1 drink a day. ? Be aware of how much alcohol is in your drink. In the [place] one drink equals one 12 oz bottle of beer (355 mL), one 5 oz glass of wine (148 mL), or one 1 oz glass of hard liquor (44 mL). Lifestyle  Take daily care of your teeth and gums.  Stay active. Exercise for at least 30 minutes on 5 or more days each week.  Do not use any products that contain nicotine or tobacco, such as cigarettes, e-cigarettes, and chewing tobacco. If you need help quitting, ask your health care provider.  If you are sexually active, practice safe sex. Use a condom or other form of birth control (contraception) in order to prevent pregnancy and STIs (sexually transmitted infections). If you plan to become pregnant, see your health care provider for a preconception visit. What's next?  Visit your health care provider once a year for a well check visit.  Ask your health care provider how often you should have your eyes and teeth checked.  Stay up to date on all vaccines. This information is not intended to replace advice given to you by your health care provider. Make sure you discuss any questions you have with your health care provider. Document Revised: 03/26/2018 Document Reviewed: 03/26/2018 Elsevier Patient Education  2020 [employer].

## 2020-02-18 LAB — CYTOLOGY - PAP
Chlamydia: NEGATIVE
Comment: NEGATIVE
Comment: NEGATIVE
Comment: NORMAL
Diagnosis: NEGATIVE
Neisseria Gonorrhea: NEGATIVE
Trichomonas: NEGATIVE

## 2020-04-04 ENCOUNTER — Telehealth: Payer: Self-pay | Admitting: Family Medicine

## 2020-04-04 NOTE — Telephone Encounter (Signed)
PATIENT HAD DEPO AND PHYSICAL AT HER OBGYN, BUT WANTED TO CONTINUE GETTING HER DEPO'S HERE AT [facility], BUT SHE IS UNSURE AS TO WHEN NEXT DEPO IS DUE. IF SHE CAN GET A CALL BACK WITH THAT INFORMATION,PLEASE.

## 2020-04-04 NOTE — Telephone Encounter (Signed)
Phone call to pt x2.  Received busy signal only. Unable to leave message.

## 2020-04-05 ENCOUNTER — Other Ambulatory Visit: Payer: Self-pay

## 2020-04-05 ENCOUNTER — Ambulatory Visit (LOCAL_COMMUNITY_HEALTH_CENTER): Payer: Medicaid Other | Admitting: Family Medicine

## 2020-04-05 ENCOUNTER — Encounter: Payer: Self-pay | Admitting: Family Medicine

## 2020-04-05 ENCOUNTER — Ambulatory Visit: Payer: Medicaid Other

## 2020-04-05 VITALS — BP 115/72 | Ht 65.0 in | Wt 161.0 lb

## 2020-04-05 DIAGNOSIS — Z3042 Encounter for surveillance of injectable contraceptive: Secondary | ICD-10-CM

## 2020-04-05 DIAGNOSIS — Z87898 Personal history of other specified conditions: Secondary | ICD-10-CM | POA: Insufficient documentation

## 2020-04-05 DIAGNOSIS — B9689 Other specified bacterial agents as the cause of diseases classified elsewhere: Secondary | ICD-10-CM

## 2020-04-05 DIAGNOSIS — Z3009 Encounter for other general counseling and advice on contraception: Secondary | ICD-10-CM

## 2020-04-05 DIAGNOSIS — Z30013 Encounter for initial prescription of injectable contraceptive: Secondary | ICD-10-CM | POA: Diagnosis not present

## 2020-04-05 DIAGNOSIS — Z113 Encounter for screening for infections with a predominantly sexual mode of transmission: Secondary | ICD-10-CM

## 2020-04-05 DIAGNOSIS — N760 Acute vaginitis: Secondary | ICD-10-CM

## 2020-04-05 LAB — WET PREP FOR TRICH, YEAST, CLUE
Trichomonas Exam: NEGATIVE
Yeast Exam: NEGATIVE

## 2020-04-05 MED ORDER — METRONIDAZOLE 500 MG PO TABS: 500.0000 mg | ORAL_TABLET | Freq: Two times a day (BID) | ORAL | 0 refills | Status: AC

## 2020-04-05 MED ORDER — MEDROXYPROGESTERONE ACETATE 150 MG/ML IM SUSP
150.0000 mg | INTRAMUSCULAR | Status: AC
  Administered 2020-04-05 – 2021-01-03 (×4): 150 mg via INTRAMUSCULAR
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
## 2020-04-05 NOTE — Telephone Encounter (Signed)
Phone call to pt. Pt states she wants to start getting depo at [facility]. Pt counseled that [facility] will need to have physical done at [facility] to be able to dispense/administer depo or other birth control. Pt states she wants to keep her appt for today (originally STD visit) and desires it to be switched to [facility] physical so we can address physical, STD and depo today.  Pt to arrive by 2:50 for physical.
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
## 2020-04-05 NOTE — Progress Notes (Signed)
Wet mount reviewed by provider; per verbal order by Cynthia Latta, FNP, pt treated for BV per standing order. Provider orders completed. 

## 2020-04-16 DIAGNOSIS — K121 Other forms of stomatitis: Secondary | ICD-10-CM | POA: Diagnosis not present

## 2020-06-13 NOTE — Addendum Note (Signed)
Addended by: Heywood Bene on: 06/13/2020 11:28 AM   Modules accepted: Orders

## 2020-07-13 ENCOUNTER — Ambulatory Visit: Payer: Medicaid Other

## 2020-07-14 ENCOUNTER — Encounter: Payer: Self-pay | Admitting: Advanced Practice Midwife

## 2020-07-14 ENCOUNTER — Ambulatory Visit (LOCAL_COMMUNITY_HEALTH_CENTER): Payer: Medicaid Other | Admitting: Advanced Practice Midwife

## 2020-07-14 ENCOUNTER — Other Ambulatory Visit: Payer: Self-pay

## 2020-07-14 VITALS — BP 122/71 | Ht 65.0 in | Wt 170.4 lb

## 2020-07-14 DIAGNOSIS — F1290 Cannabis use, unspecified, uncomplicated: Secondary | ICD-10-CM | POA: Insufficient documentation

## 2020-07-14 DIAGNOSIS — Z3009 Encounter for other general counseling and advice on contraception: Secondary | ICD-10-CM | POA: Diagnosis not present

## 2020-07-14 DIAGNOSIS — Z3042 Encounter for surveillance of injectable contraceptive: Secondary | ICD-10-CM | POA: Diagnosis not present

## 2020-07-14 LAB — WET PREP FOR TRICH, YEAST, CLUE
Trichomonas Exam: NEGATIVE
Yeast Exam: NEGATIVE

## 2020-07-14 NOTE — Progress Notes (Signed)
   [facility]  Subjective:  Katrina Ortega is a 24 y.o.SBF G1P1 exsmoker being seen today for DMPA and wants more BV medicine because wasn't compliant with medication from 04/05/20 and kept forgetting to take.  Chief Complaint  Patient presents with  . Contraception    Depo     HPI  Last MJ yesterday.  Last ETOH 06/17/20 (6 shots Tequila) q week Does the patient have a current or past history of drug use? Yes   No components found for: HCV]   Health Maintenance Due  Topic Date Due  . COVID-19 Vaccine (1) Never done  . INFLUENZA VACCINE  02/27/2020    ROS  The following portions of the patient's history were reviewed and updated as appropriate: allergies, current medications, past family history, past medical history, past social history, past surgical history and problem list. Problem list updated.   See flowsheet for other program required questions.  Objective:   Vitals:   07/14/20 1549  BP: 122/71  Weight: 170 lb 6.4 oz (77.3 kg)  Height: 5\' 5"  (1.651 m)    Physical Exam  Vagina--pink, small amt white creamy leukorrhea, ph<4.5  Assessment and Plan:  Katrina Ortega is a 24 y.o. female presenting to the [facility] Department for a Women's Health problem visit  1. Family planning Treat wet mount per standing orders Immunization nurse consult Need ROI for physical and pap from [facility] 02/16/20 (ask for 2021 please in general)  - WET PREP FOR TRICH, YEAST, CLUE  2. Encounter for surveillance of injectable contraceptive May have DMPA 150 mg IM per 04/05/20 C. 06/05/20 FNP order    No follow-ups on file.  No future appointments.  Kizzie Ide, CNM

## 2020-07-14 NOTE — Progress Notes (Signed)
Patient here Depo and requesting medication for BV.  States didn't take MTZ correctly; kept forgetting to take pills. Declines STD testing  Richmond Campbell, RN

## 2020-07-14 NOTE — Progress Notes (Signed)
Depo given; tolerated well. Wet mount reveiwed.  No tx per SO Richmond Campbell, RN

## 2020-09-11 DIAGNOSIS — H5213 Myopia, bilateral: Secondary | ICD-10-CM | POA: Diagnosis not present

## 2020-09-21 DIAGNOSIS — H5213 Myopia, bilateral: Secondary | ICD-10-CM | POA: Diagnosis not present

## 2020-10-24 ENCOUNTER — Other Ambulatory Visit: Payer: Self-pay

## 2020-10-24 ENCOUNTER — Ambulatory Visit (LOCAL_COMMUNITY_HEALTH_CENTER): Payer: Medicaid Other

## 2020-10-24 VITALS — BP 119/68 | Ht 65.0 in | Wt 171.5 lb

## 2020-10-24 DIAGNOSIS — Z3009 Encounter for other general counseling and advice on contraception: Secondary | ICD-10-CM

## 2020-10-24 DIAGNOSIS — Z3042 Encounter for surveillance of injectable contraceptive: Secondary | ICD-10-CM

## 2020-10-24 NOTE — Progress Notes (Signed)
14 weeks 4 days post depo. Voices no concerns. Depo given today per order by C. Kizzie Ide, FNP dated 04/05/2020. Tolerated depo well in L Delt. Next depo due 01/09/21, pt aware. Jerel Shepherd, RN

## 2021-01-03 ENCOUNTER — Other Ambulatory Visit: Payer: Self-pay

## 2021-01-03 ENCOUNTER — Ambulatory Visit (LOCAL_COMMUNITY_HEALTH_CENTER): Payer: Medicaid Other

## 2021-01-03 VITALS — BP 115/75 | Ht 65.0 in | Wt 177.0 lb

## 2021-01-03 DIAGNOSIS — Z3042 Encounter for surveillance of injectable contraceptive: Secondary | ICD-10-CM | POA: Diagnosis not present

## 2021-01-03 DIAGNOSIS — Z3009 Encounter for other general counseling and advice on contraception: Secondary | ICD-10-CM

## 2021-01-03 NOTE — Progress Notes (Signed)
10 weeks 1 day post depo. Local provider resource list given per pt request. Encouraged to establish PCP. RN encouraged pt to adhere to 11-13 week interval between depo injections. Pt in agreement.  Depo given today per order by C. Kizzie Ide, FNP dated 04/05/2020. Tolerated well R delt. Next depo due 03/21/2021. Jerel Shepherd, RN

## 2021-02-25 ENCOUNTER — Emergency Department: Payer: Medicaid Other

## 2021-02-25 ENCOUNTER — Other Ambulatory Visit: Payer: Self-pay

## 2021-02-25 ENCOUNTER — Emergency Department
Admission: EM | Admit: 2021-02-25 | Discharge: 2021-02-25 | Disposition: A | Discharge status: Home / Self Care | Payer: Medicaid Other | Attending: Emergency Medicine | Admitting: Emergency Medicine

## 2021-02-25 DIAGNOSIS — I10 Essential (primary) hypertension: Secondary | ICD-10-CM | POA: Insufficient documentation

## 2021-02-25 DIAGNOSIS — J069 Acute upper respiratory infection, unspecified: Secondary | ICD-10-CM | POA: Diagnosis not present

## 2021-02-25 DIAGNOSIS — Z20822 Contact with and (suspected) exposure to covid-19: Secondary | ICD-10-CM | POA: Diagnosis not present

## 2021-02-25 DIAGNOSIS — F17290 Nicotine dependence, other tobacco product, uncomplicated: Secondary | ICD-10-CM | POA: Diagnosis not present

## 2021-02-25 DIAGNOSIS — H9203 Otalgia, bilateral: Secondary | ICD-10-CM | POA: Diagnosis not present

## 2021-02-25 DIAGNOSIS — B9789 Other viral agents as the cause of diseases classified elsewhere: Secondary | ICD-10-CM | POA: Diagnosis not present

## 2021-02-25 DIAGNOSIS — R059 Cough, unspecified: Secondary | ICD-10-CM | POA: Diagnosis not present

## 2021-02-25 LAB — RESP PANEL BY RT-PCR (FLU A&B, COVID) ARPGX2
Influenza A by PCR: NEGATIVE
Influenza B by PCR: NEGATIVE
SARS Coronavirus 2 by RT PCR: NEGATIVE

## 2021-02-25 MED ORDER — BENZONATATE 100 MG PO CAPS: ORAL_CAPSULE | ORAL | 0 refills | Status: DC

## 2021-02-25 MED ORDER — PSEUDOEPH-BROMPHEN-DM 30-2-10 MG/5ML PO SYRP: 5.0000 mL | ORAL_SOLUTION | Freq: Four times a day (QID) | ORAL | 0 refills | Status: DC | PRN

## 2021-02-25 NOTE — ED Triage Notes (Signed)
Pt to ER via POV. Reports right ear pain x2 days. States her son started daycare and had an ear infection, for the last week she has had increased congestion and has been coughing up phlegm that is green. Reports possibly some yellow drainage from right ear, pain has also started in left ear. No fevers at home. Unknown covid contacts.

## 2021-02-25 NOTE — Discharge Instructions (Addendum)
Your exam and viral test are negative at this time.  Symptoms likely represent a viral upper respiratory infection with some sinus congestion causing fluid behind the ears.  Take your previously prescribed cetirizine daily as directed.  Pick up over-the-counter pseudoephedrine to help with sinus pressure and congestion.  Follow-up with your primary provider return to the ED if needed.
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
## 2021-02-26 ENCOUNTER — Telehealth: Payer: Self-pay

## 2021-02-26 NOTE — Telephone Encounter (Signed)
Transition Care Management Follow-up Telephone Call Date of discharge and from where: 02/25/2021-[facility] How have you been since you were released from the hospital? Patient stated she is doing good.  Any questions or concerns? No  Items Reviewed: Did the pt receive and understand the discharge instructions provided? Yes  Medications obtained and verified? Yes  Other? No  Any new allergies since your discharge? No  Dietary orders reviewed? N/A Do you have support at home? Yes   Home Care and Equipment/Supplies: Were home health services ordered? not applicable If so, what is the name of the agency? N/A  Has the agency set up a time to come to the patient's home? not applicable Were any new equipment or medical supplies ordered?  No What is the name of the medical supply agency? N/A Were you able to get the supplies/equipment? not applicable Do you have any questions related to the use of the equipment or supplies? No  Functional Questionnaire: (I = Independent and D = Dependent) ADLs: I  Bathing/Dressing- I  Meal Prep- I  Eating- I  Maintaining continence- I  Transferring/Ambulation- I  Managing Meds- I  Follow up appointments reviewed:  PCP Hospital f/u appt confirmed? No   Specialist Hospital f/u appt confirmed? No   Are transportation arrangements needed? No  If their condition worsens, is the pt aware to call PCP or go to the Emergency Dept.? Yes Was the patient provided with contact information for the PCP's office or ED? Yes Was to pt encouraged to call back with questions or concerns? Yes

## 2021-03-07 ENCOUNTER — Ambulatory Visit: Payer: Self-pay | Admitting: Internal Medicine

## 2021-03-20 ENCOUNTER — Ambulatory Visit: Payer: Medicaid Other | Admitting: Family Medicine

## 2021-03-27 ENCOUNTER — Ambulatory Visit (INDEPENDENT_AMBULATORY_CARE_PROVIDER_SITE_OTHER): Payer: Medicaid Other | Admitting: Family Medicine

## 2021-03-27 ENCOUNTER — Other Ambulatory Visit: Payer: Self-pay

## 2021-03-27 ENCOUNTER — Ambulatory Visit
Admission: RE | Admit: 2021-03-27 | Discharge: 2021-03-27 | Disposition: A | Discharge status: Home / Self Care | Payer: Medicaid Other | Source: Ambulatory Visit | Attending: Family Medicine | Admitting: Family Medicine

## 2021-03-27 ENCOUNTER — Ambulatory Visit
Admission: RE | Admit: 2021-03-27 | Discharge: 2021-03-27 | Disposition: A | Discharge status: Home / Self Care | Payer: Medicaid Other | Attending: Family Medicine | Admitting: Family Medicine

## 2021-03-27 ENCOUNTER — Encounter: Payer: Self-pay | Admitting: Family Medicine

## 2021-03-27 VITALS — BP 116/66 | HR 84 | Temp 98.8°F | Ht 67.0 in | Wt 175.0 lb

## 2021-03-27 DIAGNOSIS — M545 Low back pain, unspecified: Secondary | ICD-10-CM | POA: Insufficient documentation

## 2021-03-27 DIAGNOSIS — G8929 Other chronic pain: Secondary | ICD-10-CM

## 2021-03-27 DIAGNOSIS — R7989 Other specified abnormal findings of blood chemistry: Secondary | ICD-10-CM | POA: Diagnosis not present

## 2021-03-27 DIAGNOSIS — Z13220 Encounter for screening for lipoid disorders: Secondary | ICD-10-CM

## 2021-03-27 DIAGNOSIS — Z0000 Encounter for general adult medical examination without abnormal findings: Secondary | ICD-10-CM | POA: Insufficient documentation

## 2021-03-27 NOTE — Patient Instructions (Signed)
-   Obtain fasting labs with orders provided (can have water or black coffee but otherwise no food or drink x 8 hours before labs) - Review information provided - Return in 1 year for physical - Contact us for questions between now and then  Please contact to schedule an appointment:  Consuella Lose [place]: [phone] Or Consuella Lose [place]: [phone]

## 2021-03-27 NOTE — Assessment & Plan Note (Signed)
Annual examination complete, anticipatory guidance provided, HPV vaccination information provided and information to establish care with local OB/GYN. Risk stratification labs ordered to review once available.

## 2021-03-27 NOTE — Progress Notes (Signed)
Annual Physical Exam Visit  Patient Information:  Patient ID: Katrina Ortega, female DOB: [birth year] Age: 25 y.o. MRN: [id number]   Subjective:   CC: Annual Physical Exam  HPI:  Katrina Ortega is here for their annual physical.  I reviewed the past medical history, family history, social history, surgical history, and allergies today and changes were made as necessary.  Please see the problem list section below for additional details.  Health Habits Eye Exam: few months prior Dental Exam: went this month Exercise: walking dog, gym, more than an hour daily Diet: trying to eat vegetables and lean meats  GYN: Sexual Health Menstrual status: current LMP: 2 weeks prior Menses: spotting Last pap smear: [facility] months prior ([facility]) History of abnormal pap smears: per patient normal Sexually active: no Current contraception: Depo  Past Medical History: Past Medical History:  Diagnosis Date   Allergy    Anemia    Anxiety and depression    GERD (gastroesophageal reflux disease)    Group beta Strep positive 08/24/2018   Identified in urine 01/22 Will require prophylaxis in labor   Hypertension    preeclampsia   Medical history non-contributory    Mental disorder    Supervision of other normal pregnancy, antepartum 08/19/2018    Nursing Staff Provider Office Location  [place] Dating   LMP Language   ENG Anatomy US  Normal Flu Vaccine  Declined Genetic Screen  Declined  TDaP vaccine   11/04/2018 Hgb A1C or  GTT Early  Third trimester  Rhogam   N/A: A POS   LAB RESULTS  Feeding Plan  breast Blood Type A/Positive/-- (01/22 1555)  Contraception  Depo Antibody Negative (01/22 1555) Circumcision Yes Rubella 3.68 (01/22 1555) Pediatri   Past Surgical History: Past Surgical History:  Procedure Laterality Date   CESAREAN SECTION N/A 01/22/2019   Procedure: CESAREAN SECTION;  Surgeon: Catalina Antigua, MD;  Location: [facility] LD ORS;  Service: Obstetrics;  Laterality: N/A;   TOENAIL  EXCISION Bilateral 2021   Social History: Social History   Socioeconomic History   Marital status: Single    Spouse name: Not on file   Number of children: 0   Years of education: 12+   Highest education level: Some college, no degree  Occupational History   Not on file  Tobacco Use   Smoking status: Some Days    Types: Cigars   Smokeless tobacco: Never  Vaping Use   Vaping Use: Some days   Substances: Nicotine, Flavoring  Substance and Sexual Activity   Alcohol use: Yes    Alcohol/week: 6.0 standard drinks    Types: 6 Shots of liquor per week   Drug use: Yes    Types: Marijuana   Sexual activity: Not Currently    Partners: Male    Birth control/protection: Injection  Other Topics Concern   Not on file  Social History Narrative   Not on file   Social Determinants of Health   Financial Resource Strain: Not on file  Food Insecurity: Not on file  Transportation Needs: Not on file  Physical Activity: Not on file  Stress: Not on file  Social Connections: Not on file   Family History: Family History  Problem Relation Age of Onset   Multiple sclerosis Mother    Migraines Mother    Hypertension Father    Migraines Father    Other Father        tumor "in front of brain"   Eczema Son  Diabetes Paternal Uncle    Schizophrenia Maternal Grandmother    Diabetes Maternal Grandfather    Diabetes Paternal Grandmother    Hypertension Paternal Grandmother    Allergies: Allergies  Allergen Reactions   Percocet [Oxycodone-Acetaminophen] Itching   Health Maintenance: Health Maintenance  Topic Date Due   Pneumococcal Vaccine 91-72 Years old (1 - PCV) Never done   HPV VACCINES (1 - 2-dose series) Never done   INFLUENZA VACCINE  02/26/2021   COVID-19 Vaccine (1) 04/12/2021 (Originally 10/10/1999)   PAP-Cervical Cytology Screening  02/16/2023   PAP SMEAR-Modifier  02/16/2023   TETANUS/TDAP  11/03/2028   Hepatitis C Screening  Completed   HIV Screening  Completed     HM Colonoscopy     This patient has no relevant Health Maintenance data.      Medications: Current Outpatient Medications on File Prior to Visit  Medication Sig Dispense Refill   cetirizine (ZYRTEC ALLERGY) 10 MG tablet Take 1 tablet (10 mg total) by mouth daily. 30 tablet 2   medroxyPROGESTERone (DEPO-PROVERA) 150 MG/ML injection Inject 1 mL (150 mg total) into the muscle every 3 (three) months. 1 mL 2   phenylephrine-shark liver oil-mineral oil-petrolatum (PREPARATION H) 0.25-3-14-71.9 % rectal ointment Place 1 application rectally 2 (two) times daily as needed for hemorrhoids.     pseudoephedrine (SUDAFED) 120 MG 12 hr tablet Take 120 mg by mouth 2 (two) times daily.     No current facility-administered medications on file prior to visit.    Review of Systems: No headache, visual changes, nausea, vomiting, diarrhea, constipation, dizziness, abdominal pain, skin rash, fevers, chills, night sweats, swollen lymph nodes, weight loss, chest pain, body aches, +back pain, joint swelling, muscle aches, shortness of breath, mood changes, visual or auditory hallucinations reported.  Objective:   Vitals:   03/27/21 1600  BP: 116/66  Pulse: 84  Temp: 98.8 F (37.1 C)   Vitals:   03/27/21 1600  Weight: 175 lb (79.4 kg)  Height: 5\' 7"  (1.702 m)   Body mass index is 27.41 kg/m.  General: Well Developed, well nourished, and in no acute distress.  Neuro: Alert and oriented x3, extra-ocular muscles intact, sensation grossly intact. Cranial nerves II through XII are intact, motor, sensory, and coordinative functions are all intact. HEENT: Normocephalic, atraumatic, pupils equal round reactive to light, neck supple, no masses, no lymphadenopathy, thyroid nonpalpable. Oropharynx, nasopharynx, external ear canals are unremarkable. Skin: Warm and dry, no rashes noted.  Cardiac: Regular rate and rhythm, no murmurs rubs or gallops. No peripheral edema. Pulses symmetric. Respiratory: Clear to  auscultation bilaterally. Not using accessory muscles, speaking in full sentences.  Abdominal: Soft, nontender, nondistended, positive bowel sounds, no masses, no organomegaly. Musculoskeletal: Shoulder, elbow, wrist, hip, knee, ankle stable, and with full range of motion.  Female chaperone initials: BN present throughout the physical examination.  Impression and Recommendations:   The patient was counselled, risk factors were discussed, and anticipatory guidance given.  Annual physical exam Annual examination complete, anticipatory guidance provided, HPV vaccination information provided and information to establish care with local OB/GYN. Risk stratification labs ordered to review once available.   Orders & Medications Medications: No orders of the defined types were placed in this encounter.  Orders Placed This Encounter  Procedures   DG Lumbar Spine Complete   TSH Rfx on Abnormal to Free T4   Lipid panel   CBC   Comprehensive metabolic panel   VITAMIN D 25 Hydroxy (Vit-D Deficiency, Fractures)     Return for PATIENT  PREFERENCE FOR BACK PAIN EVALUATION.    Jerrol Banana, MD   Primary Care Sports Medicine [facility]

## 2021-03-28 ENCOUNTER — Encounter: Payer: Self-pay | Admitting: Family Medicine

## 2021-03-28 DIAGNOSIS — R7989 Other specified abnormal findings of blood chemistry: Secondary | ICD-10-CM | POA: Diagnosis not present

## 2021-03-28 DIAGNOSIS — Z13220 Encounter for screening for lipoid disorders: Secondary | ICD-10-CM | POA: Diagnosis not present

## 2021-03-28 DIAGNOSIS — Z0000 Encounter for general adult medical examination without abnormal findings: Secondary | ICD-10-CM | POA: Diagnosis not present

## 2021-03-28 DIAGNOSIS — Z3042 Encounter for surveillance of injectable contraceptive: Secondary | ICD-10-CM

## 2021-03-28 NOTE — Telephone Encounter (Signed)
Okay to place a referral to [facility]?

## 2021-03-29 ENCOUNTER — Other Ambulatory Visit: Payer: Self-pay | Admitting: Family Medicine

## 2021-03-29 ENCOUNTER — Other Ambulatory Visit: Payer: Self-pay

## 2021-03-29 DIAGNOSIS — R7989 Other specified abnormal findings of blood chemistry: Secondary | ICD-10-CM

## 2021-03-29 DIAGNOSIS — Z3042 Encounter for surveillance of injectable contraceptive: Secondary | ICD-10-CM

## 2021-03-29 LAB — COMPREHENSIVE METABOLIC PANEL
ALT: 13 IU/L (ref 0–32)
AST: 14 IU/L (ref 0–40)
Albumin/Globulin Ratio: 1.9 (ref 1.2–2.2)
Albumin: 4.5 g/dL (ref 3.9–5.0)
Alkaline Phosphatase: 85 IU/L (ref 44–121)
BUN/Creatinine Ratio: 10 (ref 9–23)
BUN: 8 mg/dL (ref 6–20)
Bilirubin Total: 0.4 mg/dL (ref 0.0–1.2)
CO2: 22 mmol/L (ref 20–29)
Calcium: 9.6 mg/dL (ref 8.7–10.2)
Chloride: 104 mmol/L (ref 96–106)
Creatinine, Ser: 0.79 mg/dL (ref 0.57–1.00)
Globulin, Total: 2.4 g/dL (ref 1.5–4.5)
Glucose: 97 mg/dL (ref 65–99)
Potassium: 4.2 mmol/L (ref 3.5–5.2)
Sodium: 140 mmol/L (ref 134–144)
Total Protein: 6.9 g/dL (ref 6.0–8.5)
eGFR: 106 mL/min/{1.73_m2} (ref >59)

## 2021-03-29 LAB — LIPID PANEL
Chol/HDL Ratio: 3.4 ratio (ref 0.0–4.4)
Cholesterol, Total: 187 mg/dL (ref 100–199)
HDL: 55 mg/dL (ref >39)
LDL Chol Calc (NIH): 120 mg/dL — ABNORMAL HIGH (ref 0–99)
Triglycerides: 65 mg/dL (ref 0–149)
VLDL Cholesterol Cal: 12 mg/dL (ref 5–40)

## 2021-03-29 LAB — CBC
Hematocrit: 41.6 % (ref 34.0–46.6)
Hemoglobin: 13.7 g/dL (ref 11.1–15.9)
MCH: 28.8 pg (ref 26.6–33.0)
MCHC: 32.9 g/dL (ref 31.5–35.7)
MCV: 88 fL (ref 79–97)
Platelets: 262 10*3/uL (ref 150–450)
RBC: 4.75 x10E6/uL (ref 3.77–5.28)
RDW: 12.9 % (ref 11.7–15.4)
WBC: 6.7 10*3/uL (ref 3.4–10.8)

## 2021-03-29 LAB — TSH RFX ON ABNORMAL TO FREE T4: TSH: 1.71 u[IU]/mL (ref 0.450–4.500)

## 2021-03-29 LAB — VITAMIN D 25 HYDROXY (VIT D DEFICIENCY, FRACTURES): Vit D, 25-Hydroxy: 25.6 ng/mL — ABNORMAL LOW (ref 30.0–100.0)

## 2021-03-29 MED ORDER — VITAMIN D (ERGOCALCIFEROL) 1.25 MG (50000 UNIT) PO CAPS
50000.0000 [IU] | ORAL_CAPSULE | ORAL | 0 refills | Status: DC
Start: 2021-03-29 — End: 2021-05-16

## 2021-03-29 NOTE — Progress Notes (Signed)
Andreka, the labs shows that your vitamin D level is low enough that you will require a prescription supplement for 8 weeks and I have sent it to your pharmacy to dose weekly until complete.  Additionally, your cholesterol is a bit high, follow the below recommendations: Diet & Exercise Recommendations Dietary changes to include reducing saturated fats, sodium, avoiding red meat, fried, processed foods, full-fat dairy, baked goods, and sweets. Incorporate more fruits, vegetables, fiber-rich foods such as whole grains, and transition to more eggs and lean meats. Make realistic changes where you can.

## 2021-03-29 NOTE — Telephone Encounter (Signed)
Referral placed to [facility].

## 2021-04-02 ENCOUNTER — Encounter: Payer: Self-pay | Admitting: Advanced Practice Midwife

## 2021-04-02 ENCOUNTER — Other Ambulatory Visit: Payer: Self-pay

## 2021-04-02 ENCOUNTER — Encounter: Payer: Self-pay | Admitting: Emergency Medicine

## 2021-04-02 ENCOUNTER — Ambulatory Visit
Admission: EM | Admit: 2021-04-02 | Discharge: 2021-04-02 | Disposition: A | Discharge status: Home / Self Care | Payer: Medicaid Other | Attending: Family Medicine | Admitting: Family Medicine

## 2021-04-02 DIAGNOSIS — N760 Acute vaginitis: Secondary | ICD-10-CM | POA: Insufficient documentation

## 2021-04-02 DIAGNOSIS — F17210 Nicotine dependence, cigarettes, uncomplicated: Secondary | ICD-10-CM | POA: Insufficient documentation

## 2021-04-02 DIAGNOSIS — Z202 Contact with and (suspected) exposure to infections with a predominantly sexual mode of transmission: Secondary | ICD-10-CM | POA: Insufficient documentation

## 2021-04-02 DIAGNOSIS — Z20822 Contact with and (suspected) exposure to covid-19: Secondary | ICD-10-CM | POA: Diagnosis not present

## 2021-04-02 DIAGNOSIS — B9689 Other specified bacterial agents as the cause of diseases classified elsewhere: Secondary | ICD-10-CM | POA: Diagnosis not present

## 2021-04-02 LAB — URINALYSIS, COMPLETE (UACMP) WITH MICROSCOPIC
Bilirubin Urine: NEGATIVE
Glucose, UA: NEGATIVE mg/dL
Hgb urine dipstick: NEGATIVE
Ketones, ur: NEGATIVE mg/dL
Nitrite: NEGATIVE
Protein, ur: NEGATIVE mg/dL
Specific Gravity, Urine: 1.02 (ref 1.005–1.030)
pH: 7 (ref 5.0–8.0)

## 2021-04-02 LAB — WET PREP, GENITAL
Sperm: NONE SEEN
Trich, Wet Prep: NONE SEEN
Yeast Wet Prep HPF POC: NONE SEEN

## 2021-04-02 LAB — CHLAMYDIA/NGC RT PCR (ARMC ONLY)
Chlamydia Tr: NOT DETECTED
N gonorrhoeae: NOT DETECTED

## 2021-04-02 MED ORDER — METRONIDAZOLE 500 MG PO TABS: 500.0000 mg | ORAL_TABLET | Freq: Two times a day (BID) | ORAL | 0 refills | Status: DC

## 2021-04-02 NOTE — ED Triage Notes (Signed)
Pt presents today requesting an "STD check". She reports yellow discharge and lower abdominal cramping. Denies dysuria.

## 2021-04-02 NOTE — Discharge Instructions (Addendum)
STD test results should be back tomorrow (COVID test as well). Check MyChart account.  Antibiotic twice daily for BV.  Practice safe sex.  Take care  Dr. Adriana Simas
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
## 2021-04-03 LAB — SARS CORONAVIRUS 2 (TAT 6-24 HRS): SARS Coronavirus 2: NEGATIVE

## 2021-04-04 LAB — URINE CULTURE: Culture: 40000 — AB

## 2021-04-06 ENCOUNTER — Ambulatory Visit: Payer: Medicaid Other

## 2021-04-06 ENCOUNTER — Encounter: Payer: Self-pay | Admitting: Physician Assistant

## 2021-04-06 ENCOUNTER — Ambulatory Visit (LOCAL_COMMUNITY_HEALTH_CENTER): Payer: Medicaid Other | Admitting: Physician Assistant

## 2021-04-06 ENCOUNTER — Other Ambulatory Visit: Payer: Self-pay

## 2021-04-06 VITALS — BP 119/82 | Ht 67.0 in | Wt 169.2 lb

## 2021-04-06 DIAGNOSIS — Z113 Encounter for screening for infections with a predominantly sexual mode of transmission: Secondary | ICD-10-CM

## 2021-04-06 DIAGNOSIS — Z3009 Encounter for other general counseling and advice on contraception: Secondary | ICD-10-CM | POA: Diagnosis not present

## 2021-04-06 DIAGNOSIS — Z0000 Encounter for general adult medical examination without abnormal findings: Secondary | ICD-10-CM

## 2021-04-06 DIAGNOSIS — Z3042 Encounter for surveillance of injectable contraceptive: Secondary | ICD-10-CM

## 2021-04-06 LAB — WET PREP FOR TRICH, YEAST, CLUE
Trichomonas Exam: NEGATIVE
Yeast Exam: NEGATIVE

## 2021-04-06 MED ORDER — MEDROXYPROGESTERONE ACETATE 150 MG/ML IM SUSP
150.0000 mg | INTRAMUSCULAR | Status: DC
Start: 2021-04-06 — End: 2021-07-04

## 2021-04-06 NOTE — Progress Notes (Signed)
Pt here for PE and Depo.  Wet mount results reviewed, no treatment required.  Depo 150 mg given IM in Rt deltoid without any complications. Pt given condoms. Berdie Ogren, RN

## 2021-04-07 ENCOUNTER — Encounter: Payer: Self-pay | Admitting: Physician Assistant
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
## 2021-04-11 ENCOUNTER — Encounter: Payer: Self-pay | Admitting: Family Medicine

## 2021-04-11 ENCOUNTER — Ambulatory Visit (INDEPENDENT_AMBULATORY_CARE_PROVIDER_SITE_OTHER): Payer: Medicaid Other | Admitting: Family Medicine

## 2021-04-11 ENCOUNTER — Other Ambulatory Visit: Payer: Self-pay

## 2021-04-11 VITALS — BP 104/60 | HR 78 | Temp 98.3°F | Ht 67.0 in | Wt 169.0 lb

## 2021-04-11 DIAGNOSIS — M9903 Segmental and somatic dysfunction of lumbar region: Secondary | ICD-10-CM

## 2021-04-11 DIAGNOSIS — M62830 Muscle spasm of back: Secondary | ICD-10-CM | POA: Diagnosis not present

## 2021-04-11 MED ORDER — MELOXICAM 15 MG PO TABS: ORAL_TABLET | ORAL | 0 refills | Status: AC

## 2021-04-11 MED ORDER — CYCLOBENZAPRINE HCL 5 MG PO TABS: 5.0000 mg | ORAL_TABLET | Freq: Every evening | ORAL | 0 refills | Status: DC | PRN
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
## 2021-04-11 NOTE — Assessment & Plan Note (Signed)
See additional assessment(s) for plan details. 

## 2021-04-11 NOTE — Patient Instructions (Signed)
-   Start meloxicam once daily, take with food - After 2 weeks, can dose once daily on an as-needed basis - Can dose nightly cyclobenzaprine (muscle relaxer) for muscle tightness pain, side effect and be drowsiness - Start physical therapy, referral coordinator will contact you for scheduling - Return for follow-up in 4 weeks, contact your office for questions between now and then

## 2021-04-11 NOTE — Assessment & Plan Note (Signed)
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  She denies any symptoms radiating distally, no bowel/bladder dysfunction, no weakness.  Denies any specific trauma to this area, pain is aggravated by prolonged weightbearing and alleviated by positional change relative rest.  Physical examination today reveals focal tenderness at the right greater than left sacroiliac joints, positive paraspinal lumbar spasm, negative straight leg raise bilaterally, positive Faber right.  Sensorimotor intact bilateral lower extremities.  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  I reviewed the same with the patient as well as treatment strategy, she is to start formal physical therapy, home-based rehab, initiate scheduled meloxicam and as needed cyclobenzaprine in addition to relative rest.  Plan for follow-up to assess response, if suboptimal progress noted, pending focality of symptoms, can consider local injections to the SI joints and/or paraspinal musculature trigger points.  If improved, wean from medications, transition to home-based maintenance program for rehab, and follow-up as needed.

## 2021-04-25 ENCOUNTER — Ambulatory Visit: Payer: Medicaid Other | Attending: Family Medicine | Admitting: Physical Therapy

## 2021-04-25 ENCOUNTER — Encounter: Payer: Self-pay | Admitting: Physical Therapy

## 2021-04-25 ENCOUNTER — Other Ambulatory Visit: Payer: Self-pay

## 2021-04-25 DIAGNOSIS — M6281 Muscle weakness (generalized): Secondary | ICD-10-CM | POA: Diagnosis not present

## 2021-04-25 DIAGNOSIS — M545 Low back pain, unspecified: Secondary | ICD-10-CM | POA: Diagnosis not present
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
## 2021-04-26 DIAGNOSIS — N3001 Acute cystitis with hematuria: Secondary | ICD-10-CM | POA: Diagnosis not present

## 2021-04-26 DIAGNOSIS — Z113 Encounter for screening for infections with a predominantly sexual mode of transmission: Secondary | ICD-10-CM | POA: Diagnosis not present

## 2021-04-26 DIAGNOSIS — B9689 Other specified bacterial agents as the cause of diseases classified elsewhere: Secondary | ICD-10-CM | POA: Diagnosis not present

## 2021-04-26 DIAGNOSIS — N760 Acute vaginitis: Secondary | ICD-10-CM | POA: Diagnosis not present

## 2021-04-30 ENCOUNTER — Ambulatory Visit: Payer: Medicaid Other | Attending: Family Medicine | Admitting: Physical Therapy

## 2021-04-30 ENCOUNTER — Other Ambulatory Visit: Payer: Self-pay

## 2021-04-30 ENCOUNTER — Encounter: Payer: Self-pay | Admitting: Physical Therapy

## 2021-04-30 DIAGNOSIS — M545 Low back pain, unspecified: Secondary | ICD-10-CM | POA: Insufficient documentation

## 2021-04-30 DIAGNOSIS — M6281 Muscle weakness (generalized): Secondary | ICD-10-CM | POA: Insufficient documentation
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
## 2021-05-02 ENCOUNTER — Encounter: Payer: Self-pay | Admitting: Advanced Practice Midwife

## 2021-05-02 ENCOUNTER — Ambulatory Visit: Payer: Medicaid Other | Admitting: Advanced Practice Midwife

## 2021-05-02 ENCOUNTER — Ambulatory Visit: Payer: Medicaid Other | Admitting: Physical Therapy

## 2021-05-02 ENCOUNTER — Other Ambulatory Visit: Payer: Self-pay

## 2021-05-02 VITALS — BP 119/78 | Ht 67.0 in | Wt 169.0 lb

## 2021-05-02 DIAGNOSIS — Z7689 Persons encountering health services in other specified circumstances: Secondary | ICD-10-CM | POA: Diagnosis not present

## 2021-05-02 DIAGNOSIS — M6281 Muscle weakness (generalized): Secondary | ICD-10-CM | POA: Diagnosis not present

## 2021-05-02 DIAGNOSIS — M545 Low back pain, unspecified: Secondary | ICD-10-CM | POA: Diagnosis not present
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
## 2021-05-03 ENCOUNTER — Encounter: Payer: Self-pay | Admitting: Advanced Practice Midwife

## 2021-05-03 ENCOUNTER — Encounter: Payer: Self-pay | Admitting: Physical Therapy

## 2021-05-03 NOTE — Progress Notes (Addendum)
Patient ID: Katrina Ortega, female   DOB: Jul 13, 1996, 25 y.o.   MRN: [id number]  Reason for Visit: Establish Care   Subjective:  Date of Service: 05/03/2021  HPI:   Katrina Ortega is a 25 y.o. G1 P60 female being seen to establish care with Ob/Gyn. She has had a recent annual exam. Last PAP was normal in 2021. [facility]. Most recent injection was 04/06/21. Recently she was seen at urgent care for UTI and vaginitis symptoms. Based on test results she is being treated for BV, Chlamydia and UTI. She has no new concerns today. She requests TOC in 2 weeks. We discussed vaginitis preventative measures.  Past Medical History:  Diagnosis Date   Allergy    Anemia    Anxiety and depression    GERD (gastroesophageal reflux disease)    Group beta Strep positive 08/24/2018   Identified in urine 01/22 Will require prophylaxis in labor   Hypertension    preeclampsia   Medical history non-contributory    Mental disorder    Supervision of other normal pregnancy, antepartum 08/19/2018    Nursing Staff Provider Office Location  [place] Dating   LMP Language   ENG Anatomy US  Normal Flu Vaccine  Declined Genetic Screen  Declined  TDaP vaccine   11/04/2018 Hgb A1C or  GTT Early  Third trimester  Rhogam   N/A: A POS   LAB RESULTS  Feeding Plan  breast Blood Type A/Positive/-- (01/22 1555)  Contraception  Depo Antibody Negative (01/22 1555) Circumcision Yes Rubella 3.68 (01/22 1555) Pediatri   Family History  Problem Relation Age of Onset   Multiple sclerosis Mother    Migraines Mother    Hypertension Father    Migraines Father    Other Father        tumor "in front of brain"   Eczema Son    Diabetes Paternal Uncle    Schizophrenia Maternal Grandmother    Diabetes Maternal Grandfather    Diabetes Paternal Grandmother    Hypertension Paternal Grandmother    Past Surgical History:  Procedure Laterality Date   CESAREAN SECTION N/A 01/22/2019    Procedure: CESAREAN SECTION;  Surgeon: Catalina Antigua, MD;  Location: [facility];  Service: Obstetrics;  Laterality: N/A;   TOENAIL EXCISION Bilateral 2021    Short Social History:  Social History   Tobacco Use   Smoking status: Some Days    Types: Cigars   Smokeless tobacco: Never  Substance Use Topics   Alcohol use: Yes    Alcohol/week: 6.0 standard drinks    Types: 6 Shots of liquor per week    Allergies  Allergen Reactions   Percocet [Oxycodone-Acetaminophen] Itching    Current Outpatient Medications  Medication Sig Dispense Refill   cyclobenzaprine (FLEXERIL) 5 MG tablet Take 1 tablet (5 mg total) by mouth at bedtime as needed for muscle spasms. 30 tablet 0   doxycycline (ADOXA) 100 MG tablet Take by mouth.     fluconazole (DIFLUCAN) 150 MG tablet Take by mouth.     meloxicam (MOBIC) 15 MG tablet Take 1 tablet (15 mg total) by mouth daily for 14 days, THEN 1 tablet (15 mg total) daily as needed for up to 14 days for pain. 30 tablet 0   nitrofurantoin, macrocrystal-monohydrate, (MACROBID) 100 MG capsule PLEASE SEE ATTACHED FOR DETAILED DIRECTIONS     cetirizine (ZYRTEC ALLERGY) 10 MG tablet Take 1 tablet (10 mg total) by mouth daily. 30  tablet 2   doxycycline (VIBRA-TABS) 100 MG tablet Take 100 mg by mouth 2 (two) times daily.     medroxyPROGESTERone (DEPO-PROVERA) 150 MG/ML injection Inject 1 mL (150 mg total) into the muscle every 3 (three) months. 1 mL 2   metroNIDAZOLE (FLAGYL) 500 MG tablet Take 1 tablet (500 mg total) by mouth 2 (two) times daily. (Patient not taking: Reported on 04/11/2021) 14 tablet 0   phenylephrine-shark liver oil-mineral oil-petrolatum (PREPARATION H) 0.25-3-14-71.9 % rectal ointment Place 1 application rectally 2 (two) times daily as needed for hemorrhoids.     Vitamin D, Ergocalciferol, (DRISDOL) 1.25 MG (50000 UNIT) CAPS capsule Take 1 capsule (50,000 Units total) by mouth every 7 (seven) days. Take for 8 total doses(weeks) 8 capsule 0   Current  Facility-Administered Medications  Medication Dose Route Frequency Provider Last Rate Last Admin   medroxyPROGESTERone (DEPO-PROVERA) injection 150 mg  150 mg Intramuscular Q90 days Rolley Sims, Carla J, [place]        Review of Systems  Constitutional:  Negative for chills and fever.  HENT:  Negative for congestion, ear discharge, ear pain, hearing loss, sinus pain and sore throat.   Eyes:  Negative for blurred vision and double vision.  Respiratory:  Negative for cough, shortness of breath and wheezing.   Cardiovascular:  Negative for chest pain, palpitations and leg swelling.  Gastrointestinal:  Negative for abdominal pain, blood in stool, constipation, diarrhea, heartburn, melena, nausea and vomiting.  Genitourinary:  Negative for dysuria, flank pain, frequency, hematuria and urgency.  Musculoskeletal:  Negative for back pain, joint pain and myalgias.  Skin:  Negative for itching and rash.  Neurological:  Negative for dizziness, tingling, tremors, sensory change, speech change, focal weakness, seizures, loss of consciousness, weakness and headaches.  Endo/Heme/Allergies:  Negative for environmental allergies. Does not bruise/bleed easily.  Psychiatric/Behavioral:  Negative for depression, hallucinations, memory loss, substance abuse and suicidal ideas. The patient is not nervous/anxious and does not have insomnia.        Objective:  Objective   Vitals:   05/02/21 1037  BP: 119/78  Weight: 169 lb (76.7 kg)  Height: 5\' 7"  (1.702 m)   Body mass index is 26.47 kg/m. Constitutional: Well nourished, well developed female in no acute distress.  HEENT: normal Skin: Warm and dry.  Cardiovascular: Regular rate and rhythm.   Extremity:  no edema   Respiratory: Clear to auscultation bilateral. Normal respiratory effort Neuro: DTRs 2+, Cranial nerves grossly intact Psych: Alert and Oriented x3. No memory deficits. Normal mood and affect.  MSK: normal gait, normal range of motion   Limited  physical exam today. Purpose of visit primarily consultation Time spent in care of patient: 18 minutes Assessment/Plan:     25 y.o. G1 P1 female establishing care with Ob/Gyn  Return to clinic in 2 weeks for test of cure and as needed   30 CNM [facility] 05/03/2021, 10:18 PM

## 2021-05-06 ENCOUNTER — Other Ambulatory Visit: Payer: Self-pay | Admitting: Family Medicine

## 2021-05-06 DIAGNOSIS — M62830 Muscle spasm of back: Secondary | ICD-10-CM

## 2021-05-06 NOTE — Telephone Encounter (Signed)
Requested medication (s) are due for refill today: no  Requested medication (s) are on the active medication list: yes  Last refill:  04/11/21 #30  Future visit scheduled: yes  Notes to clinic:  ends 05/09/21 same day as appt with Dr. Ashley Royalty    Requested Prescriptions  Pending Prescriptions Disp Refills   meloxicam (MOBIC) 15 MG tablet [Pharmacy Med Name: MELOXICAM 15 MG TABLET] 30 tablet 0    Sig: Take 1 tablet (15 mg total) by mouth daily for 14 days, THEN 1 tablet (15 mg total) daily as needed for up to 14 days for pain.     Analgesics:  COX2 Inhibitors Passed - 05/06/2021  9:09 AM      Passed - HGB in normal range and within 360 days    Hemoglobin  Date Value Ref Range Status  03/28/2021 13.7 11.1 - 15.9 g/dL Final          Passed - Cr in normal range and within 360 days    Creatinine, Ser  Date Value Ref Range Status  03/28/2021 0.79 0.57 - 1.00 mg/dL Final          Passed - Patient is not pregnant      Passed - Valid encounter within last 12 months    Recent Outpatient Visits           3 weeks ago Lumbar paraspinal muscle spasm   [facility] Jerrol Banana, MD   1 month ago Annual physical exam   [facility] Jerrol Banana, MD       Future Appointments             In 3 days Ashley Royalty, Ocie Bob, MD [facility]

## 2021-05-07 ENCOUNTER — Ambulatory Visit: Payer: Medicaid Other | Admitting: Physical Therapy

## 2021-05-07 NOTE — Telephone Encounter (Signed)
Refill if appropriate.  Please advise. Patient has appointment on 05/09/21 for follow-up.

## 2021-05-09 ENCOUNTER — Ambulatory Visit: Payer: Medicaid Other | Admitting: Physical Therapy

## 2021-05-09 ENCOUNTER — Other Ambulatory Visit: Payer: Self-pay

## 2021-05-09 ENCOUNTER — Ambulatory Visit: Payer: Medicaid Other | Admitting: Family Medicine

## 2021-05-09 DIAGNOSIS — M6281 Muscle weakness (generalized): Secondary | ICD-10-CM

## 2021-05-09 DIAGNOSIS — M545 Low back pain, unspecified: Secondary | ICD-10-CM

## 2021-05-10 ENCOUNTER — Other Ambulatory Visit (HOSPITAL_COMMUNITY)
Admission: RE | Admit: 2021-05-10 | Discharge: 2021-05-10 | Disposition: A | Discharge status: Home / Self Care | Payer: Medicaid Other | Source: Ambulatory Visit | Attending: Advanced Practice Midwife | Admitting: Advanced Practice Midwife

## 2021-05-10 ENCOUNTER — Ambulatory Visit (INDEPENDENT_AMBULATORY_CARE_PROVIDER_SITE_OTHER): Payer: Medicaid Other | Admitting: Advanced Practice Midwife

## 2021-05-10 ENCOUNTER — Encounter: Payer: Self-pay | Admitting: Advanced Practice Midwife

## 2021-05-10 VITALS — BP 108/63 | Ht 67.0 in | Wt 172.0 lb

## 2021-05-10 DIAGNOSIS — Z87440 Personal history of urinary (tract) infections: Secondary | ICD-10-CM

## 2021-05-10 DIAGNOSIS — Z8742 Personal history of other diseases of the female genital tract: Secondary | ICD-10-CM

## 2021-05-10 DIAGNOSIS — Z8619 Personal history of other infectious and parasitic diseases: Secondary | ICD-10-CM

## 2021-05-10 DIAGNOSIS — R3589 Other polyuria: Secondary | ICD-10-CM | POA: Diagnosis not present

## 2021-05-10 LAB — POCT URINALYSIS DIPSTICK
Bilirubin, UA: NEGATIVE
Blood, UA: NEGATIVE
Glucose, UA: NEGATIVE
Ketones, UA: NEGATIVE
Leukocytes, UA: NEGATIVE
Nitrite, UA: NEGATIVE
Protein, UA: POSITIVE — AB
Spec Grav, UA: 1.015 (ref 1.010–1.025)
Urobilinogen, UA: 0.2 E.U./dL
pH, UA: 6.5 (ref 5.0–8.0)

## 2021-05-10 NOTE — Progress Notes (Signed)
Patient ID: Katrina Ortega, female   DOB: Jun 22, 1996, 25 y.o.   MRN: [id number]  Reason for Consult: Follow-up (TOC)    Subjective:  HPI:  Katrina Ortega is a 25 y.o. female being seen for test of cure for several infections that were diagnosed 2 weeks ago at Urgent Care and treated since then. Her only symptom today is urinary frequency. She has had nausea and diarrhea since taking several different antibiotics for Chlamydia, BV, UTI. She has no other concerns today. I had recommended 2 week follow up after last visit for TOC. Patient understands that she may need to repeat labs or take additional medicine if they are still positive.  Past Medical History:  Diagnosis Date   Allergy    Anemia    Anxiety and depression    GERD (gastroesophageal reflux disease)    Group beta Strep positive 08/24/2018   Identified in urine 01/22 Will require prophylaxis in labor   Hypertension    preeclampsia   Medical history non-contributory    Mental disorder    Supervision of other normal pregnancy, antepartum 08/19/2018    Nursing Staff Provider Office Location  [place] Dating   LMP Language   ENG Anatomy US  Normal Flu Vaccine  Declined Genetic Screen  Declined  TDaP vaccine   11/04/2018 Hgb A1C or  GTT Early  Third trimester  Rhogam   N/A: A POS   LAB RESULTS  Feeding Plan  breast Blood Type A/Positive/-- (01/22 1555)  Contraception  Depo Antibody Negative (01/22 1555) Circumcision Yes Rubella 3.68 (01/22 1555) Pediatri   Family History  Problem Relation Age of Onset   Multiple sclerosis Mother    Migraines Mother    Hypertension Father    Migraines Father    Other Father        tumor "in front of brain"   Eczema Son    Diabetes Paternal Uncle    Schizophrenia Maternal Grandmother    Diabetes Maternal Grandfather    Diabetes Paternal Grandmother    Hypertension Paternal Grandmother    Past Surgical History:  Procedure Laterality Date   CESAREAN SECTION N/A 01/22/2019   Procedure: CESAREAN  SECTION;  Surgeon: Catalina Antigua, MD;  Location: [facility];  Service: Obstetrics;  Laterality: N/A;   TOENAIL EXCISION Bilateral 2021    Short Social History:  Social History   Tobacco Use   Smoking status: Some Days    Types: Cigars   Smokeless tobacco: Never  Substance Use Topics   Alcohol use: Yes    Alcohol/week: 6.0 standard drinks    Types: 6 Shots of liquor per week    Allergies  Allergen Reactions   Percocet [Oxycodone-Acetaminophen] Itching    Current Outpatient Medications  Medication Sig Dispense Refill   cetirizine (ZYRTEC ALLERGY) 10 MG tablet Take 1 tablet (10 mg total) by mouth daily. 30 tablet 2   cyclobenzaprine (FLEXERIL) 5 MG tablet Take 1 tablet (5 mg total) by mouth at bedtime as needed for muscle spasms. 30 tablet 0   doxycycline (VIBRA-TABS) 100 MG tablet Take 100 mg by mouth 2 (two) times daily.     fluconazole (DIFLUCAN) 150 MG tablet Take by mouth.     medroxyPROGESTERone (DEPO-PROVERA) 150 MG/ML injection Inject 1 mL (150 mg total) into the muscle every 3 (three) months. 1 mL 2   metroNIDAZOLE (FLAGYL) 500 MG tablet Take 1 tablet (500 mg total) by mouth 2 (two) times daily. 14 tablet 0   nitrofurantoin, macrocrystal-monohydrate, (MACROBID)  100 MG capsule PLEASE SEE ATTACHED FOR DETAILED DIRECTIONS     phenylephrine-shark liver oil-mineral oil-petrolatum (PREPARATION H) 0.25-3-14-71.9 % rectal ointment Place 1 application rectally 2 (two) times daily as needed for hemorrhoids.     Vitamin D, Ergocalciferol, (DRISDOL) 1.25 MG (50000 UNIT) CAPS capsule Take 1 capsule (50,000 Units total) by mouth every 7 (seven) days. Take for 8 total doses(weeks) 8 capsule 0   Current Facility-Administered Medications  Medication Dose Route Frequency Provider Last Rate Last Admin   medroxyPROGESTERone (DEPO-PROVERA) injection 150 mg  150 mg Intramuscular Q90 days Rolley Sims, Carla J, [place]        Review of Systems  Constitutional:  Negative for chills and fever.  HENT:   Negative for congestion, ear discharge, ear pain, hearing loss, sinus pain and sore throat.   Eyes:  Negative for blurred vision and double vision.  Respiratory:  Negative for cough, shortness of breath and wheezing.   Cardiovascular:  Negative for chest pain, palpitations and leg swelling.  Gastrointestinal:  Negative for abdominal pain, blood in stool, constipation, diarrhea, heartburn, melena, nausea and vomiting.  Genitourinary:  Positive for frequency. Negative for dysuria, flank pain, hematuria and urgency.  Musculoskeletal:  Negative for back pain, joint pain and myalgias.  Skin:  Negative for itching and rash.  Neurological:  Negative for dizziness, tingling, tremors, sensory change, speech change, focal weakness, seizures, loss of consciousness, weakness and headaches.  Endo/Heme/Allergies:  Negative for environmental allergies. Does not bruise/bleed easily.  Psychiatric/Behavioral:  Negative for depression, hallucinations, memory loss, substance abuse and suicidal ideas. The patient is not nervous/anxious and does not have insomnia.        Objective:  Objective   Vitals:   05/10/21 1353  BP: 108/63  Weight: 172 lb (78 kg)  Height: 5\' 7"  (1.702 m)   Body mass index is 26.94 kg/m. Constitutional: Well nourished, well developed female in no acute distress.  HEENT: normal Skin: Warm and dry.  Cardiovascular: Regular rate and rhythm.   Extremity:  no edema   Respiratory: Clear to auscultation bilateral. Normal respiratory effort Neuro: DTRs 2+, Cranial nerves grossly intact Psych: Alert and Oriented x3. No memory deficits. Normal mood and affect.   Pelvic exam:  is not limited by body habitus EGBUS: within normal limits Vagina: within normal limits and with normal mucosa  Cervix: not evaluated   Assessment/Plan:     25 y.o. G1 P1001 female TOC; chlamydia, BV, UTI  Aptima swab; STDs/BV Urine Culture Follow up as needed after labs result    30  CNM [facility] 05/10/2021, 2:33 PM

## 2021-05-13 LAB — URINE CULTURE

## 2021-05-14 ENCOUNTER — Other Ambulatory Visit: Payer: Self-pay

## 2021-05-14 ENCOUNTER — Ambulatory Visit: Payer: Medicaid Other | Admitting: Physical Therapy

## 2021-05-14 ENCOUNTER — Encounter: Payer: Self-pay | Admitting: Physical Therapy

## 2021-05-14 DIAGNOSIS — M545 Low back pain, unspecified: Secondary | ICD-10-CM

## 2021-05-14 DIAGNOSIS — M6281 Muscle weakness (generalized): Secondary | ICD-10-CM

## 2021-05-14 LAB — CERVICOVAGINAL ANCILLARY ONLY
Bacterial Vaginitis (gardnerella): NEGATIVE
Candida Glabrata: NEGATIVE
Candida Vaginitis: NEGATIVE
Chlamydia: NEGATIVE
Comment: NEGATIVE
Comment: NEGATIVE
Comment: NEGATIVE
Comment: NEGATIVE
Comment: NEGATIVE
Comment: NORMAL
Neisseria Gonorrhea: NEGATIVE
Trichomonas: NEGATIVE
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
## 2021-05-16 ENCOUNTER — Ambulatory Visit: Payer: Medicaid Other | Admitting: Physical Therapy

## 2021-05-16 ENCOUNTER — Other Ambulatory Visit: Payer: Self-pay

## 2021-05-16 ENCOUNTER — Ambulatory Visit: Payer: Medicaid Other | Admitting: Family Medicine

## 2021-05-16 ENCOUNTER — Encounter: Payer: Self-pay | Admitting: Family Medicine

## 2021-05-16 VITALS — BP 114/64 | HR 84 | Ht 67.0 in | Wt 171.0 lb

## 2021-05-16 DIAGNOSIS — M4727 Other spondylosis with radiculopathy, lumbosacral region: Secondary | ICD-10-CM | POA: Diagnosis not present

## 2021-05-16 DIAGNOSIS — R7989 Other specified abnormal findings of blood chemistry: Secondary | ICD-10-CM

## 2021-05-16 MED ORDER — DULOXETINE HCL 30 MG PO CPEP: ORAL_CAPSULE | ORAL | 0 refills | Status: DC

## 2021-05-16 MED ORDER — MELOXICAM 15 MG PO TABS
15.0000 mg | ORAL_TABLET | Freq: Every day | ORAL | 0 refills | Status: DC | PRN
Start: 2021-05-16 — End: 2021-07-25

## 2021-05-16 MED ORDER — VITAMIN D (ERGOCALCIFEROL) 1.25 MG (50000 UNIT) PO CAPS: 50000.0000 [IU] | ORAL_CAPSULE | ORAL | 0 refills | Status: DC

## 2021-05-16 NOTE — Patient Instructions (Addendum)
-   Continue meloxicam, can dose this on an as-needed basis with food - Start new medication duloxetine (1 capsule) nightly, after 2 weeks increase to 2 capsules nightly - Continue with physical therapy and try to perform daily home exercises - Return for follow-up in 4 weeks - Can contact alternate OB/GYN group with phone number below:  [facility] [phone]
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
## 2021-05-17 NOTE — Assessment & Plan Note (Signed)
Chronic symptomatology noted despite adherence to physical therapy and medications other than muscle relaxer.  At this stage advised continued PT, meloxicam, and initiation of duloxetine.  We will coordinate follow-up to assess her response.  For recalcitrant symptoms, advanced imaging versus local injections to be considered.

## 2021-05-21 ENCOUNTER — Ambulatory Visit: Payer: Medicaid Other | Admitting: Physical Therapy

## 2021-05-21 ENCOUNTER — Other Ambulatory Visit: Payer: Self-pay

## 2021-05-21 ENCOUNTER — Encounter: Payer: Self-pay | Admitting: Physical Therapy

## 2021-05-21 DIAGNOSIS — M545 Low back pain, unspecified: Secondary | ICD-10-CM | POA: Diagnosis not present

## 2021-05-21 DIAGNOSIS — M6281 Muscle weakness (generalized): Secondary | ICD-10-CM | POA: Diagnosis not present
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
## 2021-05-23 ENCOUNTER — Ambulatory Visit: Payer: Medicaid Other | Admitting: Physical Therapy

## 2021-05-23 NOTE — Patient Instructions (Incomplete)
°  °  °  TREATMENT °  °  °Manual Therapy - for symptom modulation, soft tissue sensitivity and mobility, joint mobility, ROM  °  °STM/DTM and IASTM with Hypervolt R rhomboid/periscapular musculature, R>L L1-L5 longissimus lumborum, L QL °CPA gr I-II L1-5, gr III for T5-T12 °  °*not today* °STM/IASTM bilateral gluteus medius  °  °   °Therapeutic Exercise - for improved soft tissue flexibility and extensibility as needed for lumbar spine ROM; thoracic mobility as needed for improved tolerance of ADLs, bed mobility, and work duties °  °Cat Camel; x10 ea dir °  °Quadruped sidebending; x10 ea dir  °  °Lower trunk rotations; x10, with QL bias (figure-4 position, bilateral LE) °  °Bridge; 2x10 °  °  °*next visit* °Bird Dog °  °  °*not today* °Active hamstring stretch; x20 each LE °Prone Hip Extension; x10 alternating °Seated scapular retraction; 2x10, 5 sec °Open book; x10 on ea side °  °  °  °*not today* °MHP (unbilled) utilized following manual therapy for analgesic effect along thoracolumbar paraspinals and improved soft tissue extensibility;  x 5 minutes in prone lying.  °  °  °  °ASSESSMENT °Patient has mild pain during today's session with most notable sensitivity along L QL, R lower lumbar paraspinals, and bilateral L1-3 longissimus lumborum. She reports that most of her symptoms occur late in the day following standing/ambulatory activity and working. She has improved compliance with her HEP. She had recent change in medical management per plan given by referring physician; pt has not yet started new medication regimen. Pt tolerates progression of mobility work and resumption of bridging in clinic well today. Unable to complete all planned activities due to late arrival today. Patient has remaining deficits in thoracolumbar ROM, thoracic mobility, hip/gluteal weakness, and R>L thoracolumbar paraspinal pain. Patient will benefit from continued skilled therapeutic intervention to address the above deficits as needed  for improved function and QoL.   °  °

## 2021-05-24 ENCOUNTER — Ambulatory Visit: Payer: Medicaid Other | Admitting: Physical Therapy

## 2021-05-24 NOTE — Patient Instructions (Incomplete)
°  °  °  TREATMENT     Manual Therapy - for symptom modulation, soft tissue sensitivity and mobility, joint mobility, ROM    STM/DTM and IASTM with Hypervolt R rhomboid/periscapular musculature, R>L L1-L5 longissimus lumborum, L QL CPA gr I-II L1-5, gr III for T5-T12   *not today* STM/IASTM bilateral gluteus medius       Therapeutic Exercise - for improved soft tissue flexibility and extensibility as needed for lumbar spine ROM; thoracic mobility as needed for improved tolerance of ADLs, bed mobility, and work duties   Civil engineer, contracting; x10 ea dir   Quadruped sidebending; x10 ea dir    Lower trunk rotations; x10, with QL bias (figure-4 position, bilateral LE)   Bridge; 2x10     *next visit* Bird Dog     *not today* Active hamstring stretch; x20 each LE Prone Hip Extension; x10 alternating Seated scapular retraction; 2x10, 5 sec Open book; x10 on ea side       *not today* MHP (unbilled) utilized following manual therapy for analgesic effect along thoracolumbar paraspinals and improved soft tissue extensibility;  x 5 minutes in prone lying.        ASSESSMENT Patient has mild pain during today's session with most notable sensitivity along L QL, R lower lumbar paraspinals, and bilateral L1-3 longissimus lumborum. She reports that most of her symptoms occur late in the day following standing/ambulatory activity and working. She has improved compliance with her HEP. She had recent change in medical management per plan given by referring physician; pt has not yet started new medication regimen. Pt tolerates progression of mobility work and resumption of bridging in clinic well today. Unable to complete all planned activities due to late arrival today. Patient has remaining deficits in thoracolumbar ROM, thoracic mobility, hip/gluteal weakness, and R>L thoracolumbar paraspinal pain. Patient will benefit from continued skilled therapeutic intervention to address the above deficits as needed  for improved function and QoL.

## 2021-05-28 ENCOUNTER — Ambulatory Visit: Payer: Medicaid Other | Admitting: Physical Therapy

## 2021-05-28 NOTE — Patient Instructions (Incomplete)
OBJECTIVE FINDINGS     Palpation Tenderness to palpation along R>L L1-L3 longissimus lumborum, bilateral gluteus medius      Strength (out of 5) R/L 4+/4 Hip flexion 4/4- Hip ER 4/4-* (gluteal pain) Hip IR 5/5 Hip adduction 4/4- Hip extension 5/5 Knee extension 5/5 Knee flexion 5/5 Ankle dorsiflexion 5/5 Ankle plantarflexion *Indicates pain     AROM (degrees) R/L (all movements include overpressure unless otherwise stated) Lumbar forward flexion (65): 100%* (R mid-back) Lumbar extension (30): 100%* (central low back and thoracic paraspinals L>R)  Lumbar lateral flexion (25): R: 100% L: 100% Thoracic and Lumbar rotation (30 degrees):  R: 75% L: 75% Hip IR (0-45): R: WNL L: WNL Hip ER (0-45): R: 30 deg L: 30 deg Hip Flexion (0-125): R WNL, L WNL *Indicates pain     Repeated Movements Not performed    Muscle Length Hamstrings: R: WNL L: WNL  [place]: R 75%, L 75% Thomas: not tested     Passive Accessory Intervertebral Motion (PAIVM) Pt has reproduction of back pain with CPA L1-L3. Normal mobility with springy end-feel in lumbar spine. Moderate thoracic hypomobility.   Special Tests SLUMP     TREATMENT     Manual Therapy - for symptom modulation, soft tissue sensitivity and mobility, joint mobility, ROM    STM/DTM and IASTM with Hypervolt R rhomboid/periscapular musculature, R>L L1-L5 longissimus lumborum, L QL CPA gr I-II L1-5, gr III for T5-T12   *not today* STM/IASTM bilateral gluteus medius       Therapeutic Exercise - for improved soft tissue flexibility and extensibility as needed for lumbar spine ROM; thoracic mobility as needed for improved tolerance of ADLs, bed mobility, and work duties   Civil engineer, contracting; x10 ea dir   Quadruped sidebending; x10 ea dir    Lower trunk rotations; x10, with QL bias (figure-4 position, bilateral LE)   Bridge; 2x10     *next visit* Bird Dog     *not today* Active hamstring stretch; x20 each LE Prone Hip Extension;  x10 alternating Seated scapular retraction; 2x10, 5 sec Open book; x10 on ea side       *not today* MHP (unbilled) utilized following manual therapy for analgesic effect along thoracolumbar paraspinals and improved soft tissue extensibility;  x 5 minutes in prone lying.        ASSESSMENT Patient has mild pain during today's session with most notable sensitivity along L QL, R lower lumbar paraspinals, and bilateral L1-3 longissimus lumborum. She reports that most of her symptoms occur late in the day following standing/ambulatory activity and working. She has improved compliance with her HEP. She had recent change in medical management per plan given by referring physician; pt has not yet started new medication regimen. Pt tolerates progression of mobility work and resumption of bridging in clinic well today. Unable to complete all planned activities due to late arrival today. Patient has remaining deficits in thoracolumbar ROM, thoracic mobility, hip/gluteal weakness, and R>L thoracolumbar paraspinal pain. Patient will benefit from continued skilled therapeutic intervention to address the above deficits as needed for improved function and QoL.

## 2021-05-30 ENCOUNTER — Other Ambulatory Visit: Payer: Self-pay

## 2021-05-30 ENCOUNTER — Ambulatory Visit: Payer: Medicaid Other | Attending: Family Medicine | Admitting: Physical Therapy

## 2021-05-30 DIAGNOSIS — M545 Low back pain, unspecified: Secondary | ICD-10-CM | POA: Diagnosis not present

## 2021-05-30 DIAGNOSIS — M6281 Muscle weakness (generalized): Secondary | ICD-10-CM | POA: Diagnosis not present
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
## 2021-05-31 ENCOUNTER — Encounter: Payer: Self-pay | Admitting: Physical Therapy

## 2021-05-31 DIAGNOSIS — R112 Nausea with vomiting, unspecified: Secondary | ICD-10-CM | POA: Diagnosis not present

## 2021-05-31 DIAGNOSIS — A084 Viral intestinal infection, unspecified: Secondary | ICD-10-CM | POA: Diagnosis not present

## 2021-06-04 ENCOUNTER — Encounter: Payer: Self-pay | Admitting: Physical Therapy

## 2021-06-04 ENCOUNTER — Ambulatory Visit: Payer: Medicaid Other | Admitting: Physical Therapy

## 2021-06-04 DIAGNOSIS — M545 Low back pain, unspecified: Secondary | ICD-10-CM | POA: Diagnosis not present

## 2021-06-04 DIAGNOSIS — M6281 Muscle weakness (generalized): Secondary | ICD-10-CM | POA: Diagnosis not present
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
## 2021-06-06 ENCOUNTER — Ambulatory Visit: Payer: Medicaid Other | Admitting: Physical Therapy

## 2021-06-06 NOTE — Patient Instructions (Incomplete)
°  °  °  TREATMENT °  °  °  °Manual Therapy - for symptom modulation, soft tissue sensitivity and mobility, thoracic accessory mobility as needed for improved thoracolumbar ROM  °  °STM R>L L1-L5 longissimus lumborum, bilat gluteus medius °CPA gr I-II L1-5 °  °*not today* °STM L QL °STM/DTM and IASTM with Hypervolt R rhomboid/periscapular musculature,  °  °  °  °Trigger Point Dry Needling (TDN), unbilled °Education performed with patient regarding potential benefit of TDN. Reviewed precautions and risks with patient. Extensive time spent with pt to ensure full understanding of TDN risks. Pt provided verbal consent to treatment. TDN performed to R L3-L4 iliocostalis lumborum, R L5 iliocostalis lumborum, L L4 iliocostalis lumborum, bilateral S2 multifidus with 0.25 x 60 single needle placements with local twitch response (LTR). Pistoning technique utilized. Improved pain-free motion following intervention.  °  °  °   °Therapeutic Exercise - for improved soft tissue flexibility and extensibility as needed for lumbar spine ROM; thoracic mobility as needed for improved tolerance of ADLs, bed mobility, and work duties °  °Cat Camel; x10 ea dir °  °Lower trunk rotations; x10, with QL bias (figure-4 position, bilateral LE) °  °Quadruped sidebending; x10 ea dir  °  °Bird Dog; x8, alt °  °Bridge; 2x10, 3 sec hold °  °  °*not today* °Active hamstring stretch; x20 each LE °Prone Hip Extension; x10 alternating °Seated scapular retraction; 2x10, 5 sec °Open book; x10 on ea side °  °  °  °*not today* °MHP (unbilled) utilized following manual therapy for analgesic effect along thoracolumbar paraspinals and improved soft tissue extensibility;  x 5 minutes in prone lying.  °  °  °  °ASSESSMENT °Patient has ongoing low-to-moderate intensity pain at this time. She does feel that her pain experience has subjectively been better since last week. She has primary complaint of pain affecting R lower lumbar paraspinal region. Concordant sign is  reproduced with palpation of R L3-5 iliocostalis lumborum. Pt tolerates dry needling well today with minimal post-treatment soreness. Sound twitch was obtained at lumbar paraspinals treated today. Pt is able to resume progressive exercise and trunk stabilization work in clinic without notable c/o pain. Patient has remaining deficits in thoracolumbar ROM, thoracic mobility, hip/gluteal weakness, and R>L thoracolumbar paraspinal and axial lumbosacral pain. Patient will benefit from continued skilled therapeutic intervention to address the above deficits as needed for improved function and QoL.   °  °

## 2021-06-08 ENCOUNTER — Other Ambulatory Visit: Payer: Self-pay | Admitting: Family Medicine

## 2021-06-08 DIAGNOSIS — M4727 Other spondylosis with radiculopathy, lumbosacral region: Secondary | ICD-10-CM

## 2021-06-08 NOTE — Telephone Encounter (Signed)
Requested medications are due for refill today has enough to last until appt  Requested medications are on the active medication list yes  Last refill 05/16/21  Last visit 05/16/21  Future visit scheduled 06/14/21  Notes to clinic Please assess. Requested Prescriptions  Pending Prescriptions Disp Refills   DULoxetine (CYMBALTA) 30 MG capsule [Pharmacy Med Name: DULOXETINE HCL DR 30 MG CAP] 138 capsule 1    Sig: TAKE 1 CAPSULE BY MOUTH EVERY EVENING FOR 14 DAYS, THEN 2 CAPSULES EVERY EVENING FOR 16 DAYS.     Psychiatry: Antidepressants - SNRI Passed - 06/08/2021 11:35 AM      Passed - Completed PHQ-2 or PHQ-9 in the last 360 days      Passed - Last BP in normal range    BP Readings from Last 1 Encounters:  05/16/21 114/64          Passed - Valid encounter within last 6 months    Recent Outpatient Visits           3 weeks ago Lumbosacral spondylosis with radiculopathy   [facility] Jerrol Banana, MD   1 month ago Lumbar paraspinal muscle spasm   [facility] Jerrol Banana, MD   2 months ago Annual physical exam   [facility] Jerrol Banana, MD       Future Appointments             In 6 days Ashley Royalty Ocie Bob, MD [facility], [facility]

## 2021-06-11 ENCOUNTER — Ambulatory Visit: Payer: Medicaid Other | Admitting: Physical Therapy

## 2021-06-11 NOTE — Patient Instructions (Incomplete)
°  °  TREATMENT       Manual Therapy - for symptom modulation, soft tissue sensitivity and mobility, thoracic accessory mobility as needed for improved thoracolumbar ROM    STM R>L L1-L5 longissimus lumborum, bilat gluteus medius CPA gr I-II L1-5   *not today* STM L QL STM/DTM and IASTM with Hypervolt R rhomboid/periscapular musculature,        Trigger Point Dry Needling (TDN), unbilled Education performed with patient regarding potential benefit of TDN. Reviewed precautions and risks with patient. Extensive time spent with pt to ensure full understanding of TDN risks. Pt provided verbal consent to treatment. TDN performed to R L3-L4 iliocostalis lumborum, R L5 iliocostalis lumborum, L L4 iliocostalis lumborum, bilateral S2 multifidus with 0.25 x 60 single needle placements with local twitch response (LTR). Pistoning technique utilized. Improved pain-free motion following intervention.         Therapeutic Exercise - for improved soft tissue flexibility and extensibility as needed for lumbar spine ROM; thoracic mobility as needed for improved tolerance of ADLs, bed mobility, and work duties   Civil engineer, contracting; x10 ea dir   Lower trunk rotations; x10, with QL bias (figure-4 position, bilateral LE)   Quadruped sidebending; x10 ea dir    Bird Dog; x8, alt   Bridge; 2x10, 3 sec hold     *not today* Active hamstring stretch; x20 each LE Prone Hip Extension; x10 alternating Seated scapular retraction; 2x10, 5 sec Open book; x10 on ea side       *not today* MHP (unbilled) utilized following manual therapy for analgesic effect along thoracolumbar paraspinals and improved soft tissue extensibility;  x 5 minutes in prone lying.        ASSESSMENT Patient has ongoing low-to-moderate intensity pain at this time. She does feel that her pain experience has subjectively been better since last week. She has primary complaint of pain affecting R lower lumbar paraspinal region. Concordant sign is  reproduced with palpation of R L3-5 iliocostalis lumborum. Pt tolerates dry needling well today with minimal post-treatment soreness. Sound twitch was obtained at lumbar paraspinals treated today. Pt is able to resume progressive exercise and trunk stabilization work in clinic without notable c/o pain. Patient has remaining deficits in thoracolumbar ROM, thoracic mobility, hip/gluteal weakness, and R>L thoracolumbar paraspinal and axial lumbosacral pain. Patient will benefit from continued skilled therapeutic intervention to address the above deficits as needed for improved function and QoL.

## 2021-06-13 ENCOUNTER — Encounter: Payer: Self-pay | Admitting: Physical Therapy

## 2021-06-13 ENCOUNTER — Ambulatory Visit: Payer: Medicaid Other | Admitting: Physical Therapy

## 2021-06-13 ENCOUNTER — Other Ambulatory Visit: Payer: Self-pay

## 2021-06-13 DIAGNOSIS — M545 Low back pain, unspecified: Secondary | ICD-10-CM

## 2021-06-13 DIAGNOSIS — M6281 Muscle weakness (generalized): Secondary | ICD-10-CM
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
## 2021-06-14 ENCOUNTER — Ambulatory Visit (INDEPENDENT_AMBULATORY_CARE_PROVIDER_SITE_OTHER): Payer: Medicaid Other

## 2021-06-14 ENCOUNTER — Ambulatory Visit
Admission: EM | Admit: 2021-06-14 | Discharge: 2021-06-14 | Disposition: A | Discharge status: Home / Self Care | Payer: Medicaid Other | Attending: Emergency Medicine | Admitting: Emergency Medicine

## 2021-06-14 ENCOUNTER — Ambulatory Visit: Payer: Self-pay | Admitting: *Deleted

## 2021-06-14 ENCOUNTER — Ambulatory Visit: Payer: Medicaid Other | Admitting: Family Medicine

## 2021-06-14 ENCOUNTER — Encounter: Payer: Self-pay | Admitting: Emergency Medicine

## 2021-06-14 DIAGNOSIS — R1013 Epigastric pain: Secondary | ICD-10-CM | POA: Diagnosis not present

## 2021-06-14 DIAGNOSIS — R112 Nausea with vomiting, unspecified: Secondary | ICD-10-CM | POA: Diagnosis not present

## 2021-06-14 DIAGNOSIS — R109 Unspecified abdominal pain: Secondary | ICD-10-CM

## 2021-06-14 DIAGNOSIS — R197 Diarrhea, unspecified: Secondary | ICD-10-CM | POA: Diagnosis not present

## 2021-06-14 MED ORDER — ONDANSETRON HCL 4 MG PO TABS: 4.0000 mg | ORAL_TABLET | Freq: Four times a day (QID) | ORAL | 0 refills | Status: DC

## 2021-06-14 MED ORDER — DICYCLOMINE HCL 20 MG PO TABS: 20.0000 mg | ORAL_TABLET | Freq: Two times a day (BID) | ORAL | 0 refills | Status: DC

## 2021-06-14 MED ORDER — ALUMINUM-MAGNESIUM-SIMETHICONE 200-200-20 MG/5ML PO SUSP: 30.0000 mL | Freq: Three times a day (TID) | ORAL | 0 refills | Status: DC

## 2021-06-14 NOTE — Telephone Encounter (Signed)
Reason for Disposition . [1] MILD or MODERATE vomiting AND [2] present > 48 hours (2 days) (Exception: mild vomiting with associated diarrhea)  Answer Assessment - Initial Assessment Questions 1. VOMITING SEVERITY: "How many times have you vomited in the past 24 hours?"     - MILD:  1 - 2 times/day    - MODERATE: 3 - 5 times/day, decreased oral intake without significant weight loss or symptoms of dehydration    - SEVERE: 6 or more times/day, vomits everything or nearly everything, with significant weight loss, symptoms of dehydration      2 xs. 2. ONSET: "When did the vomiting begin?"      2 days ago 1 time yesterday AM  1 time day before 3. FLUIDS: "What fluids or food have you vomited up today?" "Have you been able to keep any fluids down?"     No vomiting today 4. ABDOMINAL PAIN: "Are your having any abdominal pain?" If yes : "How bad is it and what does it feel like?" (e.g., crampy, dull, intermittent, constant)      LAst night, upper "like gas."  Presently abdominal pain left upper Quad. 5-6/10 aching, sharp at times 5. DIARRHEA: "Is there any diarrhea?" If Yes, ask: "How many times today?"      Watery,yesterday 5 times 6. CONTACTS: "Is there anyone else in the family with the same symptoms?"      son 7. CAUSE: "What do you think is causing your vomiting?"     Unsure 8. HYDRATION STATUS: "Any signs of dehydration?" (e.g., dry mouth [not only dry lips], too weak to stand) "When did you last urinate?"     Little darker, cloudy 9. OTHER SYMPTOMS: "Do you have any other symptoms?" (e.g., fever, headache, vertigo, vomiting blood or coffee grounds, recent head injury)     Weakness, no fever 10. PREGNANCY: "Is there any chance you are pregnant?" "When was your last menstrual period?"       no  Protocols used: Vomiting-A-AH

## 2021-06-14 NOTE — Discharge Instructions (Addendum)
Your abdominal ultrasound today was negative  Please collect stool sample at home and return to a [facility] lab, you will be notified of any concerning results   Take dicyclomine twice a day for the next 10 days, this will help with stomach cramping  You may use Maalox before each meal and at bedtime to help reduce acid and gas  You may use Zofran every 6 hours as needed to help with nausea and vomiting  Please follow-up with your primary care doctor in the next 2 to 3 weeks for reevaluation of symptoms
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
## 2021-06-14 NOTE — ED Notes (Signed)
Called patient in from parking lot. 

## 2021-06-14 NOTE — Telephone Encounter (Signed)
Pt evasive historian. Reports diarrhea, vomiting. Seen in UC 05/31/21 "Viral Enteritis." States son diagnosed with same.  Reports one episode of vomiting yesterday morning and one the day before, none today. Reports diarrhea "Watery" 5 times last 24 hours. Reports upper abdominal pain last night, "Bad like gas." Presently intermittent 5-6/10, "Happens sometimes after eating.". Also reports dizziness yesterday, none presently. States afebrile. Reports "Little weak."States 2 nights ago "Saw a little piece of tissue with maybe blood in my BM." None since.  Pt has appt with Dr. Yetta Barre tomorrow. Already established. HOme care advise given for meantime. Advised ED for worsening symptoms. Pt verbalizes understanding.  Please review, ensure tomorrows appt appropriate for these acute symptoms.

## 2021-06-14 NOTE — ED Triage Notes (Signed)
Pt presents today with c/o of abdominal cramping, n/v/d, body aches x 3 weeks. She c/o blood in stool. Denies fever.

## 2021-06-15 ENCOUNTER — Ambulatory Visit: Payer: Medicaid Other | Admitting: Family Medicine

## 2021-06-15 LAB — GASTROINTESTINAL PANEL BY PCR, STOOL (REPLACES STOOL CULTURE)

## 2021-06-18 ENCOUNTER — Ambulatory Visit: Payer: Medicaid Other | Admitting: Physical Therapy

## 2021-06-18 ENCOUNTER — Ambulatory Visit: Payer: Medicaid Other | Admitting: Family Medicine

## 2021-06-18 ENCOUNTER — Encounter: Payer: Self-pay | Admitting: Physical Therapy

## 2021-06-18 ENCOUNTER — Other Ambulatory Visit: Payer: Self-pay

## 2021-06-18 DIAGNOSIS — M6281 Muscle weakness (generalized): Secondary | ICD-10-CM

## 2021-06-18 DIAGNOSIS — M545 Low back pain, unspecified: Secondary | ICD-10-CM
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
## 2021-06-20 ENCOUNTER — Ambulatory Visit: Payer: Medicaid Other | Admitting: Physical Therapy

## 2021-07-04 ENCOUNTER — Encounter: Payer: Medicaid Other | Admitting: Obstetrics and Gynecology

## 2021-07-04 ENCOUNTER — Encounter: Payer: Self-pay | Admitting: Obstetrics and Gynecology

## 2021-07-04 ENCOUNTER — Other Ambulatory Visit: Payer: Self-pay

## 2021-07-04 ENCOUNTER — Other Ambulatory Visit: Payer: Self-pay | Admitting: Advanced Practice Midwife

## 2021-07-04 ENCOUNTER — Telehealth: Payer: Self-pay | Admitting: Obstetrics and Gynecology

## 2021-07-04 DIAGNOSIS — Z3042 Encounter for surveillance of injectable contraceptive: Secondary | ICD-10-CM

## 2021-07-04 MED ORDER — MEDROXYPROGESTERONE ACETATE 150 MG/ML IM SUSP: 150.0000 mg | INTRAMUSCULAR | 2 refills | Status: DC

## 2021-07-04 NOTE — Telephone Encounter (Signed)
Pt had annual at [facility] 9/22. On depo, would like [place] to Rx if for her. Neg STD testing 10/22 with [facility], not sex active since.   Meds ordered this encounter  Medications   medroxyPROGESTERone (DEPO-PROVERA) 150 MG/ML injection    Sig: Inject 1 mL (150 mg total) into the muscle every 3 (three) months.    Dispense:  1 mL    Refill:  2    Order Specific Question:   Supervising Provider    Answer:   Nadara Mustard [[id number]]

## 2021-07-04 NOTE — Progress Notes (Signed)
This encounter was created in error - please disregard.

## 2021-07-05 ENCOUNTER — Ambulatory Visit: Payer: Medicaid Other | Admitting: Obstetrics and Gynecology

## 2021-07-05 ENCOUNTER — Ambulatory Visit (INDEPENDENT_AMBULATORY_CARE_PROVIDER_SITE_OTHER): Payer: Medicaid Other

## 2021-07-05 DIAGNOSIS — Z3042 Encounter for surveillance of injectable contraceptive: Secondary | ICD-10-CM | POA: Diagnosis not present

## 2021-07-05 MED ORDER — MEDROXYPROGESTERONE ACETATE 150 MG/ML IM SUSP
150.0000 mg | Freq: Once | INTRAMUSCULAR | Status: AC
  Administered 2021-07-05: 150 mg via INTRAMUSCULAR

## 2021-07-05 NOTE — Progress Notes (Signed)
Pt here for depo which was given IM left deltoid. Pt states she was glad I rubbed the alcohol off.   Pt stated it burned while plunging in the medication.  NDC# [phone]

## 2021-07-11 ENCOUNTER — Ambulatory Visit: Payer: Medicaid Other | Admitting: Advanced Practice Midwife

## 2021-07-20 ENCOUNTER — Ambulatory Visit
Admission: EM | Admit: 2021-07-20 | Discharge: 2021-07-20 | Disposition: A | Discharge status: Home / Self Care | Payer: Medicaid Other | Attending: Physician Assistant | Admitting: Physician Assistant

## 2021-07-20 ENCOUNTER — Other Ambulatory Visit: Payer: Self-pay

## 2021-07-20 ENCOUNTER — Ambulatory Visit: Payer: Medicaid Other | Admitting: Family Medicine

## 2021-07-20 ENCOUNTER — Ambulatory Visit (INDEPENDENT_AMBULATORY_CARE_PROVIDER_SITE_OTHER): Payer: Medicaid Other

## 2021-07-20 DIAGNOSIS — S62633A Displaced fracture of distal phalanx of left middle finger, initial encounter for closed fracture: Secondary | ICD-10-CM

## 2021-07-20 DIAGNOSIS — S6992XA Unspecified injury of left wrist, hand and finger(s), initial encounter: Secondary | ICD-10-CM
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
## 2021-07-20 NOTE — Discharge Instructions (Signed)
-  You have a fracture of the end part of your finger.  We have put you in a finger splint and I would wear that as often as possible and avoid using this finger especially for the next 1 to 2 weeks. - You can take it out to apply ice. - Take 1-2 ibuprofen 1-2 times a day to help with swelling and discomfort. - If no improvement in the next week, follow-up with orthopedics.  You have a condition requiring you to follow up with Orthopedics so please call one of the following office for appointment:   [facility] [address] [place], [place] [address] Phone: [phone]  [facility] [address] [place], [place] [address] Phone: [phone]

## 2021-07-20 NOTE — ED Triage Notes (Signed)
Pt c/o finger pain x2weeks. Pt fell on her left hand and landed on her left middle finger. Pt states that it feels like something is "pushed back" on her finger tip. Pt states that when she fell, the finger was swollen and had turned blue in coloration.

## 2021-07-24 ENCOUNTER — Ambulatory Visit: Payer: Medicaid Other | Admitting: Family Medicine

## 2021-07-25 ENCOUNTER — Ambulatory Visit
Admission: RE | Admit: 2021-07-25 | Discharge: 2021-07-25 | Disposition: A | Discharge status: Home / Self Care | Payer: Medicaid Other | Attending: Family Medicine | Admitting: Family Medicine

## 2021-07-25 ENCOUNTER — Encounter: Payer: Self-pay | Admitting: Family Medicine

## 2021-07-25 ENCOUNTER — Ambulatory Visit
Admission: RE | Admit: 2021-07-25 | Discharge: 2021-07-25 | Disposition: A | Discharge status: Home / Self Care | Payer: Medicaid Other | Source: Ambulatory Visit | Attending: Family Medicine | Admitting: Family Medicine

## 2021-07-25 ENCOUNTER — Other Ambulatory Visit: Payer: Self-pay

## 2021-07-25 ENCOUNTER — Ambulatory Visit (INDEPENDENT_AMBULATORY_CARE_PROVIDER_SITE_OTHER): Payer: Medicaid Other | Admitting: Family Medicine

## 2021-07-25 VITALS — BP 112/78 | HR 86 | Ht 67.0 in | Wt 167.0 lb

## 2021-07-25 DIAGNOSIS — S62663A Nondisplaced fracture of distal phalanx of left middle finger, initial encounter for closed fracture: Secondary | ICD-10-CM

## 2021-07-25 DIAGNOSIS — M4727 Other spondylosis with radiculopathy, lumbosacral region: Secondary | ICD-10-CM | POA: Diagnosis not present

## 2021-07-25 DIAGNOSIS — M9903 Segmental and somatic dysfunction of lumbar region: Secondary | ICD-10-CM

## 2021-07-25 MED ORDER — VITAMIN D (ERGOCALCIFEROL) 1.25 MG (50000 UNIT) PO CAPS
50000.0000 [IU] | ORAL_CAPSULE | ORAL | 0 refills | Status: DC
Start: 2021-07-25 — End: 2022-09-06

## 2021-07-25 MED ORDER — DULOXETINE HCL 60 MG PO CPEP
60.0000 mg | ORAL_CAPSULE | Freq: Every day | ORAL | 0 refills | Status: DC
Start: 2021-07-25 — End: 2022-07-01

## 2021-07-25 NOTE — Assessment & Plan Note (Signed)
Right-hand-dominant patient presenting with left third digit fingertip traumatic pain with date of injury approximately 12/9.  At that time she describes "jamming "the finger.  Treated this with supportive care at home, when symptoms failed to improve after 2 weeks, she presented to the urgent care on 07/20/2021 where x-rays were obtained, revealed to have a distal phalanx third digit fracture, nondisplaced, was advised a splint, and follow-up.  Unfortunately, patient self discontinued splint shortly thereafter, additionally she sustained a new injury (slamming finger in car door).  This new injury resulted in a flare of symptoms, new onset swelling, no ecchymosis.  Physical examination today reveals swollen, mildly ecchymotic distal phalanx on the left third digit, passive range of motion is full, though with pain, she does have active extension and flexion at the DIP of the left third digit, sensorimotor otherwise intact.  Given history of new trauma on area of fracture, repeat x-rays ordered today, she was placed into a Stax splint, and have advised compliance with splint until follow-up in 2 weeks time.  She is to obtain new x-rays outside of the splint prior to her follow-up for review.  We will contact the patient if treatment needs to be changed based on updated x-rays.  Additionally, Rx vitamin D 8-week course and daily OTC calcium 1000 mg advised.

## 2021-07-25 NOTE — Assessment & Plan Note (Signed)
This is a chronic issue that has stabilized, she has tolerated physical therapy and medication titration. We will use duloxetine 60 mg and have her discontinue meloxicam.

## 2021-07-25 NOTE — Patient Instructions (Addendum)
-   Get new x-ray today (remove splint for x-ray then replace) - Wear splint and wrap daily until return - Take weekly Rx vitamin D and daily OTC calcium 1000 mg - Perform activity as tolerated - Obtain new x-rays prior to return - Return in 2 weeks
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
## 2021-07-25 NOTE — Assessment & Plan Note (Signed)
Chronic issue with noted stability following duloxetine titration dosing, she has been somewhat sporadic with dosing but has noted benefit. I have discussed weaning and discontinuing versus Rx management. We have agreed to 60 mg daily dosing, new Rx sent to pharmacy. She is to also discontinue meloxicam as symptoms have stabilized.

## 2021-08-08 ENCOUNTER — Ambulatory Visit: Payer: Medicaid Other | Admitting: Family Medicine

## 2021-08-09 ENCOUNTER — Ambulatory Visit
Admission: RE | Admit: 2021-08-09 | Discharge: 2021-08-09 | Disposition: A | Discharge status: Home / Self Care | Payer: Medicaid Other | Source: Ambulatory Visit | Attending: Family Medicine | Admitting: Family Medicine

## 2021-08-09 ENCOUNTER — Ambulatory Visit
Admission: RE | Admit: 2021-08-09 | Discharge: 2021-08-09 | Disposition: A | Discharge status: Home / Self Care | Payer: Medicaid Other | Attending: Family Medicine | Admitting: Family Medicine

## 2021-08-09 ENCOUNTER — Encounter: Payer: Self-pay | Admitting: Family Medicine

## 2021-08-09 ENCOUNTER — Other Ambulatory Visit: Payer: Self-pay

## 2021-08-09 ENCOUNTER — Ambulatory Visit: Payer: Medicaid Other | Admitting: Family Medicine

## 2021-08-09 VITALS — BP 120/62 | HR 84 | Ht 67.0 in | Wt 166.0 lb

## 2021-08-09 DIAGNOSIS — M7989 Other specified soft tissue disorders: Secondary | ICD-10-CM | POA: Diagnosis not present

## 2021-08-09 DIAGNOSIS — S62663A Nondisplaced fracture of distal phalanx of left middle finger, initial encounter for closed fracture: Secondary | ICD-10-CM

## 2021-08-09 DIAGNOSIS — S62663D Nondisplaced fracture of distal phalanx of left middle finger, subsequent encounter for fracture with routine healing: Secondary | ICD-10-CM | POA: Diagnosis not present

## 2021-08-09 DIAGNOSIS — Z1339 Encounter for screening examination for other mental health and behavioral disorders: Secondary | ICD-10-CM | POA: Diagnosis not present

## 2021-08-09 NOTE — Assessment & Plan Note (Signed)
Right-hand-dominant patient with left third distal phalanx fracture from injury sustained approximately 07/06/2021.  At the last visit she was placed into a finger splint, take Rx vitamin D, daily calcium, and return with new x-rays.  Since that time she has noted improvement in her pain, decreased swelling, denies any symptoms while in the splint.  The new x-rays reveal interval callus, expected bony resorption as part of the fracture healing process, questionable dorsal angulation.  Examination shows maintained active flexion and extension throughout the left third digit at the MCP, PIP, and DIP.  Advised the patient to utilize the brace to allow increased dorsal pressure, adjustment made, to begin a home-based rehab program to avoid stiffness, and to perform this at least twice a day, continue vitamin D, daily calcium, and recheck x-rays and clinical findings in 4 weeks time.

## 2021-08-09 NOTE — Patient Instructions (Addendum)
-   Continue with finger splint, use it as discussed today (solid part on top) - Come out of splint at least 1-2 times daily to perform home exercises with information provided, focus on gradually increasing range of motion and function back to normal/baseline by 4 weeks - Continue weekly vitamin D and dose daily calcium 2000 mg - Obtain new x-ray prior to follow-up in 4 weeks (obtain right before the visit) - Referral coordinator will contact you in regards to ADHD evaluation - Return for follow-up in 4 weeks, contact for questions or concerns between now and then

## 2021-08-09 NOTE — Progress Notes (Signed)
°  ° °  Primary Care / Sports Medicine Office Visit  Patient Information:  Patient ID: Katrina Ortega, female DOB: [birth year] Age: 26 y.o. MRN: [id number]   Katrina Ortega is a pleasant 26 y.o. female presenting with the following:  Chief Complaint  Patient presents with   Finger Injury    Closed nondisplaced fracture of distal phalanx of left middle finger; repeat X-Ray today before appointment; wearing splint consistently; right-handedness; 7/10 pain     Vitals:   08/09/21 1138  BP: 120/62  Pulse: 84  SpO2: 98%   Vitals:   08/09/21 1138  Weight: 166 lb (75.3 kg)  Height: 5\' 7"  (1.702 m)   Body mass index is 26 kg/m.     Independent interpretation of notes and tests performed by another provider:   Today's x-rays show mild dorsal angulation, may be artifact from positioning, no change in alignment otherwise, noted callus about the fracture line, and expected bony resorption, no new injuries noted.  Procedures performed:   None  Pertinent History, Exam, Impression, and Recommendations:   Closed nondisplaced fracture of distal phalanx of left middle finger Right-hand-dominant patient with left third distal phalanx fracture from injury sustained approximately 07/06/2021.  At the last visit she was placed into a finger splint, take Rx vitamin D, daily calcium, and return with new x-rays.  Since that time she has noted improvement in her pain, decreased swelling, denies any symptoms while in the splint.  The new x-rays reveal interval callus, expected bony resorption as part of the fracture healing process, questionable dorsal angulation.  Examination shows maintained active flexion and extension throughout the left third digit at the MCP, PIP, and DIP.  Advised the patient to utilize the brace to allow increased dorsal pressure, adjustment made, to begin a home-based rehab program to avoid stiffness, and to perform this at least twice a day, continue vitamin D, daily calcium,  and recheck x-rays and clinical findings in 4 weeks time.   Orders & Medications No orders of the defined types were placed in this encounter.  Orders Placed This Encounter  Procedures   DG Finger Middle Left   Ambulatory referral to Psychiatry     Return in about 4 weeks (around 09/06/2021).     11/04/2021, MD   Primary Care Sports Medicine [place] [facility]

## 2021-08-13 ENCOUNTER — Encounter: Payer: Self-pay | Admitting: Family Medicine

## 2021-09-06 ENCOUNTER — Ambulatory Visit: Payer: Medicaid Other | Admitting: Family Medicine

## 2021-09-11 ENCOUNTER — Other Ambulatory Visit: Payer: Self-pay

## 2021-09-11 ENCOUNTER — Encounter: Payer: Self-pay | Admitting: Family Medicine

## 2021-09-11 ENCOUNTER — Telehealth: Payer: Self-pay

## 2021-09-11 ENCOUNTER — Ambulatory Visit
Admission: RE | Admit: 2021-09-11 | Discharge: 2021-09-11 | Disposition: A | Discharge status: Home / Self Care | Payer: Medicaid Other | Attending: Family Medicine | Admitting: Family Medicine

## 2021-09-11 ENCOUNTER — Ambulatory Visit
Admission: RE | Admit: 2021-09-11 | Discharge: 2021-09-11 | Disposition: A | Discharge status: Home / Self Care | Payer: Medicaid Other | Source: Ambulatory Visit | Attending: Family Medicine | Admitting: Family Medicine

## 2021-09-11 ENCOUNTER — Ambulatory Visit: Payer: Medicaid Other | Admitting: Family Medicine

## 2021-09-11 VITALS — BP 102/72 | HR 110 | Ht 67.0 in | Wt 165.0 lb

## 2021-09-11 DIAGNOSIS — N760 Acute vaginitis: Secondary | ICD-10-CM | POA: Insufficient documentation

## 2021-09-11 DIAGNOSIS — S62663D Nondisplaced fracture of distal phalanx of left middle finger, subsequent encounter for fracture with routine healing: Secondary | ICD-10-CM

## 2021-09-11 DIAGNOSIS — B9689 Other specified bacterial agents as the cause of diseases classified elsewhere: Secondary | ICD-10-CM | POA: Insufficient documentation

## 2021-09-11 DIAGNOSIS — S62663A Nondisplaced fracture of distal phalanx of left middle finger, initial encounter for closed fracture: Secondary | ICD-10-CM | POA: Diagnosis not present

## 2021-09-11 LAB — POCT URINALYSIS DIPSTICK
Bilirubin, UA: NEGATIVE
Glucose, UA: NEGATIVE
Ketones, UA: 15
Leukocytes, UA: NEGATIVE
Nitrite, UA: NEGATIVE
Protein, UA: NEGATIVE
Spec Grav, UA: 1.015 (ref 1.010–1.025)
Urobilinogen, UA: 0.2 E.U./dL
pH, UA: 6 (ref 5.0–8.0)

## 2021-09-11 LAB — POCT WET PREP WITH KOH
KOH Prep POC: NEGATIVE
RBC Wet Prep HPF POC: 2
Trichomonas, UA: NEGATIVE

## 2021-09-11 MED ORDER — METRONIDAZOLE 500 MG PO TABS: 500.0000 mg | ORAL_TABLET | Freq: Two times a day (BID) | ORAL | 0 refills | Status: AC

## 2021-09-11 NOTE — Patient Instructions (Signed)
-   Restart using brace on a regular basis, only remove briefly for exercises then replace - Obtain new x-rays in 4 weeks, our office will contact her with results - Dose medication for full 7-day course, recommend probiotic - Follow-up as needed

## 2021-09-11 NOTE — Assessment & Plan Note (Signed)
Patient presents with roughly 2-week history of malodorous and discharge vaginally, whitish at times, no significant dysuria, no flank pain, no fevers or chills.  Has been attempting different hygiene products to address symptoms to no avail.  Examination reveals soft, nontender, nondistended abdomen, normoactive bowel sounds, suprapubic region nontender, no CVA tenderness.  We obtained urinalysis, vaginal self-swab, urine culture, and was noted to have positive blood in the urine, clue cells, and 3+ bacteria.  Her stated symptoms and findings are most consistent with vaginal vaginosis, Flagyl course prescribed.  She can follow-up as needed for this issue.

## 2021-09-11 NOTE — Telephone Encounter (Signed)
Pt calling; was dx'd today with BV by PCP; has been having problems c BV; how to prevent it - hygiene, freshness.  Appt or discuss on phone.  [phone]

## 2021-09-11 NOTE — Progress Notes (Signed)
°  ° °  Primary Care / Sports Medicine Office Visit  Patient Information:  Patient ID: Katrina Ortega, female DOB: 1995-11-27 Age: 26 y.o. MRN: [id number]   Katrina Ortega is a pleasant 26 y.o. female presenting with the following:  Chief Complaint  Patient presents with   Finger Injury    Closed nondisplaced fracture of distal phalanx of left middle finger   Urinary symptoms    Vitals:   09/11/21 1457  BP: 102/72  Pulse: (!) 110  SpO2: 99%   Vitals:   09/11/21 1457  Weight: 165 lb (74.8 kg)  Height: 5\' 7"  (1.702 m)   Body mass index is 25.84 kg/m.  No results found.   Independent interpretation of notes and tests performed by another provider:   None  Procedures performed:   None  Pertinent History, Exam, Impression, and Recommendations:   Closed nondisplaced fracture of distal phalanx of left middle finger Patient presents for follow-up to closedfracture of the distal phalanx of left middle finger, date of injury approximately 07/06/2021.  At last visit on 08/09/2021, I advised continued brace usage, twice daily home exercises, continued vitamin D, daily calcium, and recheck of x-rays in 4 weeks time.  Unfortunately, she missed her appointment on 09/06/2021, rescheduled for today, since the last visit she utilized a brace for roughly 1 week, noted improvement, and decided to discontinue the finger brace (Stax splint).  She denies any worsening symptoms but continues to note moderate discomfort with ADLs.  Examination today reveals full painful range of motion with active flexion and extension, she does have focal tenderness at the distal phalanx of the third digit on the left hand.  Sensorimotor intact.  I discussed the recent x-ray findings showing no significant interval healing and concern for impending nonunion in the setting of compliance barriers.  At this stage I encouraged her to return to constant brace usage, okay to remove briefly for home exercises, maintain  calcium and vitamin D, obtain another set of x-rays in 4 weeks which will place the patient at the 90-day timeframe.  She can contact 11/04/2021 so we can review the x-ray results over MyChart.  If suboptimal progress that time, can consider seeking 1 similar device, surgical options, or continued monitoring.  Bacterial vaginosis Patient presents with roughly 2-week history of malodorous and discharge vaginally, whitish at times, no significant dysuria, no flank pain, no fevers or chills.  Has been attempting different hygiene products to address symptoms to no avail.  Examination reveals soft, nontender, nondistended abdomen, normoactive bowel sounds, suprapubic region nontender, no CVA tenderness.  We obtained urinalysis, vaginal self-swab, urine culture, and was noted to have positive blood in the urine, clue cells, and 3+ bacteria.  Her stated symptoms and findings are most consistent with vaginal vaginosis, Flagyl course prescribed.  She can follow-up as needed for this issue.   Orders & Medications Meds ordered this encounter  Medications   metroNIDAZOLE (FLAGYL) 500 MG tablet    Sig: Take 1 tablet (500 mg total) by mouth 2 (two) times daily for 7 days.    Dispense:  14 tablet    Refill:  0   Orders Placed This Encounter  Procedures   Urine Culture   DG Finger Middle Left   POCT Urinalysis Dipstick   POCT Wet Prep with KOH     No follow-ups on file.     [place], MD   Primary Care Sports Medicine [place] [facility]

## 2021-09-11 NOTE — Assessment & Plan Note (Signed)
Patient presents for follow-up to closedfracture of the distal phalanx of left middle finger, date of injury approximately 07/06/2021.  At last visit on 08/09/2021, I advised continued brace usage, twice daily home exercises, continued vitamin D, daily calcium, and recheck of x-rays in 4 weeks time.  Unfortunately, she missed her appointment on 09/06/2021, rescheduled for today, since the last visit she utilized a brace for roughly 1 week, noted improvement, and decided to discontinue the finger brace (Stax splint).  She denies any worsening symptoms but continues to note moderate discomfort with ADLs.  Examination today reveals full painful range of motion with active flexion and extension, she does have focal tenderness at the distal phalanx of the third digit on the left hand.  Sensorimotor intact.  I discussed the recent x-ray findings showing no significant interval healing and concern for impending nonunion in the setting of compliance barriers.  At this stage I encouraged her to return to constant brace usage, okay to remove briefly for home exercises, maintain calcium and vitamin D, obtain another set of x-rays in 4 weeks which will place the patient at the 90-day timeframe.  She can contact us so we can review the x-ray results over MyChart.  If suboptimal progress that time, can consider seeking 1 similar device, surgical options, or continued monitoring.

## 2021-09-13 NOTE — Telephone Encounter (Signed)
Patient is scheduled for 09/17/21 with ABC 

## 2021-09-14 LAB — URINE CULTURE

## 2021-09-17 ENCOUNTER — Ambulatory Visit: Payer: Medicaid Other | Admitting: Obstetrics and Gynecology

## 2021-09-17 ENCOUNTER — Other Ambulatory Visit: Payer: Self-pay

## 2021-09-20 ENCOUNTER — Other Ambulatory Visit: Payer: Self-pay

## 2021-09-20 ENCOUNTER — Ambulatory Visit (LOCAL_COMMUNITY_HEALTH_CENTER): Payer: Medicaid Other | Admitting: Nurse Practitioner

## 2021-09-20 ENCOUNTER — Encounter: Payer: Self-pay | Admitting: Physician Assistant

## 2021-09-20 VITALS — BP 97/65 | Ht 67.0 in | Wt 168.6 lb

## 2021-09-20 DIAGNOSIS — Z309 Encounter for contraceptive management, unspecified: Secondary | ICD-10-CM

## 2021-09-20 DIAGNOSIS — Z3042 Encounter for surveillance of injectable contraceptive: Secondary | ICD-10-CM

## 2021-09-20 DIAGNOSIS — Z30013 Encounter for initial prescription of injectable contraceptive: Secondary | ICD-10-CM

## 2021-09-20 MED ORDER — MEDROXYPROGESTERONE ACETATE 150 MG/ML IM SUSP
150.0000 mg | INTRAMUSCULAR | Status: AC
  Administered 2021-09-20 – 2022-03-08 (×3): 150 mg via INTRAMUSCULAR

## 2021-09-20 NOTE — Progress Notes (Signed)
Patient here for Depo at 11 weeks since last Depo at [facility]. Last PE ACHD 04/06/2021 with order per C. Hampton for Depo 150mg  IM q 11-13 weeks for 1 year. Last Pap 02/16/2020, NILM. Patient has been having Depo at [facility] and last Depo was 07/05/2021 per encounter notes. Depo given today, per 04/06/2021 orders, given right deltoid, tolerated well, next Depo card given.06/06/2021, RN

## 2021-10-11 ENCOUNTER — Ambulatory Visit: Payer: Medicaid Other | Admitting: Family Medicine

## 2021-10-16 ENCOUNTER — Ambulatory Visit: Payer: Medicaid Other | Admitting: Family Medicine

## 2021-10-29 ENCOUNTER — Ambulatory Visit (LOCAL_COMMUNITY_HEALTH_CENTER): Payer: Medicaid Other

## 2021-10-29 DIAGNOSIS — Z23 Encounter for immunization: Secondary | ICD-10-CM | POA: Diagnosis not present

## 2021-10-29 DIAGNOSIS — Z111 Encounter for screening for respiratory tuberculosis: Secondary | ICD-10-CM

## 2021-10-29 DIAGNOSIS — Z719 Counseling, unspecified: Secondary | ICD-10-CM

## 2021-10-29 NOTE — Progress Notes (Signed)
?  Are you feeling sick today? No ? ? ?Have you ever received a dose of COVID-19 Vaccine? [employer], [place], [place], Novavax, Other) No ? ?If yes, which vaccine and how many doses?    ? ? ?Did you bring the vaccination record card or other documentation?  No ? ? ?Do you have a health condition or are undergoing treatment that makes you moderately or severely immunocompromised? This would include, but not be limited to: cancer, HIV, organ transplant, immunosuppressive therapy/high-dose corticosteroids, or moderate/severe primary immunodeficiency.  No ? ?Have you received COVID-19 vaccine before or during hematopoietic cell transplant (HCT) or CAR-T-cell therapies? No ? ?Have you ever had an allergic reaction to: (This would include a severe allergic reaction or a reaction that caused hives, swelling, or respiratory distress, including wheezing.) A component of a COVID-19 vaccine or a previous dose of COVID-19 vaccine? No ? ? ?Have you ever had an allergic reaction to another vaccine (other thanCOVID-19 vaccine) or an injectable medication? (This would include a severe allergic reaction or a reaction that caused hives, swelling, or respiratory distress, including wheezing.)   No ?  ?Do you have a history of any of the following: ? ?Myocarditis or Pericarditis No ?Thrombosis with thrombocytopenia syndrome (TTS) No ?Multisystem Inflammatory Syndrome (MIS-C or MIS-A)? No ?Immune-mediate syndrome defined by thrombosis and thrombocytopenia, such as heparin--induced thrombocytopenia (HIT)  No ?Guillain-Barr? Syndrome (GBS) No ?COVID-19 disease within the past 3 months? No ?Vaccinated with monkeypox vaccine in the last 4 weeks? No ? ?Patient in nurse clinic for immunizations for CNA class. Influenza and Pfizer monovalent administered. Recommended follow up schedule explained and patient verbalized understanding. [facility] and COVID card updated. Ranae Palms, RN ?

## 2021-11-01 ENCOUNTER — Ambulatory Visit: Payer: Medicaid Other | Admitting: Family Medicine

## 2021-11-01 ENCOUNTER — Ambulatory Visit (LOCAL_COMMUNITY_HEALTH_CENTER): Payer: Medicaid Other

## 2021-11-01 ENCOUNTER — Other Ambulatory Visit: Payer: Self-pay

## 2021-11-01 DIAGNOSIS — Z113 Encounter for screening for infections with a predominantly sexual mode of transmission: Secondary | ICD-10-CM

## 2021-11-01 DIAGNOSIS — Z0389 Encounter for observation for other suspected diseases and conditions ruled out: Secondary | ICD-10-CM | POA: Diagnosis not present

## 2021-11-01 DIAGNOSIS — Z111 Encounter for screening for respiratory tuberculosis: Secondary | ICD-10-CM

## 2021-11-01 LAB — WET PREP FOR TRICH, YEAST, CLUE
Trichomonas Exam: NEGATIVE
Yeast Exam: NEGATIVE

## 2021-11-01 LAB — TB SKIN TEST
Induration: 0 mm
TB Skin Test: NEGATIVE

## 2021-11-01 LAB — HM HIV SCREENING LAB: HM HIV Screening: NEGATIVE

## 2021-11-01 NOTE — Patient Instructions (Signed)
Steps to prevent BV and yeast: ?Wear all-cotton underwear ?Sleep without underwear ?Take showers instead of baths ?Wear loose fitting clothing, especially during warm/hot weather ?Use a hair dryer on low after bathing to dry the area ?Avoid scented soaps and body washes ?Do not douche ?May try over the counter probiotics or boric acid gel or suppositories ?Stop smoking Natural Remedies for Bacterial Vaginosis ? ?Option #1 ?1 Tbsp Fractitionated Coconut Oil ?10 drops of Melaleuca (Tea Tree) Oil ? ?Mix ingredients together well.  Soak 3-4 tampons (in applicators) in that mixture until all or mostly all mixture is soaked up into the tampons.  Insert 1 saturated tampon vaginally and wear overnight for 3-4 nights.   ? ?Option #2 (sometimes to be used in conjunction with option #1) ?Fill tub with enough to cover lap/lower abdomen warm water.  Mix 1/2 cup of baking soda in water.  Soak in water/baking soda mixture for at least 20 minutes.  Be sure to swish water in between legs to get as much in vagina as possible.  This soak should be done after sexual intercourse and menstrual cycles.   ? ? ?Option #3 (sometimes to be used in conjunction with option #1 and 2) ?Fill tub with enough to cover lap/lower abdomen warm water.  Mix 2-4 cup of apple cider vinegar in water.  Soak in water/vinegar mixture for at least 20 minutes.  Be sure to swish water in between legs to get as much in vagina as possible.  This soak should be done after sexual intercourse and menstrual cycles.   ? ?GO WHITE: ?Soap: UNSCENTED Dove (white box light green writing) ?Laundry detergent (underwear)- Dreft or [employer] unscented ?WHITE 100% cotton panties (NOT just cotton crouch) ?Sanitary napkin/panty liners: UNSCENTED.  If it doesn't SAY unscented it can have a scent/perfume    ?NO PERFUMES OR LOTIONS OR POTIONS in the vulvar area (may use regular [place]) ?Condoms: hypoallergenic only. Non dyed (no color) ?Toilet papers: white only ?Wash clothes: use a  separate wash cloth. WHITE.  Wash in Dreft.  ? ?You can purchase Tea Tree Oil locally at: ? ?[employer] ?[address] ?[place] [address] ? ?[employer] ?[address] ?[place] [address] ? ?Advise that these alternatives will not replace the need to be evaluated if symptoms persist. You will need to seek care at an OB/GYN provider.  ?

## 2021-11-01 NOTE — Progress Notes (Signed)
Pt seen for STI screening. WET PREP negative. No treatment required per standing orders.  Lethea Killings RN ?
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
## 2021-11-21 ENCOUNTER — Ambulatory Visit
Admission: RE | Admit: 2021-11-21 | Discharge: 2021-11-21 | Disposition: A | Discharge status: Home / Self Care | Payer: Medicaid Other | Source: Ambulatory Visit | Attending: Family Medicine | Admitting: Family Medicine

## 2021-11-21 ENCOUNTER — Ambulatory Visit
Admission: RE | Admit: 2021-11-21 | Discharge: 2021-11-21 | Disposition: A | Discharge status: Home / Self Care | Payer: Medicaid Other | Attending: Family Medicine | Admitting: Family Medicine

## 2021-11-21 DIAGNOSIS — S62663D Nondisplaced fracture of distal phalanx of left middle finger, subsequent encounter for fracture with routine healing: Secondary | ICD-10-CM | POA: Insufficient documentation

## 2021-12-10 ENCOUNTER — Ambulatory Visit (LOCAL_COMMUNITY_HEALTH_CENTER): Payer: Medicaid Other

## 2021-12-10 VITALS — BP 121/79 | Ht 67.0 in | Wt 169.0 lb

## 2021-12-10 DIAGNOSIS — Z3009 Encounter for other general counseling and advice on contraception: Secondary | ICD-10-CM

## 2021-12-10 DIAGNOSIS — Z3042 Encounter for surveillance of injectable contraceptive: Secondary | ICD-10-CM | POA: Diagnosis not present

## 2021-12-10 NOTE — Progress Notes (Signed)
11 weeks 4 days post depo.  C/o very light and occasional spotting.  States occasional headache but nothing changed since last depo.  Depo given IM left deltoid per order by C. Hampton PA dated 04/06/2021.  Tolerated well.   ? ?Next depo due 02/25/22; appt reminder given to call for appt.  ? ?Cherlynn Polo, RN ?

## 2021-12-13 ENCOUNTER — Ambulatory Visit: Payer: Medicaid Other

## 2021-12-17 ENCOUNTER — Telehealth: Payer: Medicaid Other | Admitting: Emergency Medicine

## 2021-12-17 DIAGNOSIS — R6889 Other general symptoms and signs: Secondary | ICD-10-CM

## 2021-12-17 NOTE — Progress Notes (Signed)
Pt submitted 3 separate evisits for 3 unrelated complaints. Given mulitple complaints, directed to urgent care.  

## 2021-12-17 NOTE — Progress Notes (Signed)
Pt submitted 3 evisits - one for allergies, one for pink eye, one for athletes foot. Multiple complaints - will need to be seen in person.   Hello Katrina Ortega, you have several symptoms that don't seem relatd. So, based on what you shared with me, I feel your condition warrants further evaluation and I recommend that you be seen in a face to face visit.   NOTE: There will be NO CHARGE for this eVisit   If you are having a true medical emergency please call 911.      For an urgent face to face visit, [facility] has six urgent care centers for your convenience:     [facility] at [facility] Directions [phone] [address] [place], [place] [address]    [facility]) Get Driving Directions [phone] [address] [place], [place] [address]  [facility] Center [facility]) Get Driving Directions [phone] [address] [place],  [place]  [address]  [facility] Health Urgent Care at [facility] Get Driving Directions [phone] [address] [place], [place] [address]   [facility] Health Urgent Care at [facility] Get Driving Directions  [phone] [address] [place], [place] [address]   [facility] Health Urgent Care at [facility] Directions [phone] [address] [place], [place] [address]  Your MyChart E-visit questionnaire answers were reviewed by a board certified advanced clinical practitioner to complete your personal care plan based on your specific symptoms.  Thank you for using e-Visits.

## 2021-12-17 NOTE — Progress Notes (Signed)
Pt submitted 3 separate evisits for 3 unrelated complaints. Given mulitple complaints, directed to urgent care.

## 2021-12-25 ENCOUNTER — Ambulatory Visit (LOCAL_COMMUNITY_HEALTH_CENTER): Payer: Medicaid Other

## 2021-12-25 DIAGNOSIS — Z111 Encounter for screening for respiratory tuberculosis: Secondary | ICD-10-CM

## 2021-12-25 DIAGNOSIS — Z23 Encounter for immunization: Secondary | ICD-10-CM

## 2021-12-25 DIAGNOSIS — Z719 Counseling, unspecified: Secondary | ICD-10-CM

## 2021-12-25 NOTE — Progress Notes (Signed)
  Are you feeling sick today? No   Have you ever received a dose of COVID-19 Vaccine? [employer], [place], [place], [place], Other) Yes  If yes, which vaccine and how many doses?   Pfizer monovalent 1 dose   Did you bring the vaccination record card or other documentation?  Yes   Do you have a health condition or are undergoing treatment that makes you moderately or severely immunocompromised? This would include, but not be limited to: cancer, HIV, organ transplant, immunosuppressive therapy/high-dose corticosteroids, or moderate/severe primary immunodeficiency.  No  Have you received COVID-19 vaccine before or during hematopoietic cell transplant (HCT) or CAR-T-cell therapies? No  Have you ever had an allergic reaction to: (This would include a severe allergic reaction or a reaction that caused hives, swelling, or respiratory distress, including wheezing.) A component of a COVID-19 vaccine or a previous dose of COVID-19 vaccine? No   Have you ever had an allergic reaction to another vaccine (other thanCOVID-19 vaccine) or an injectable medication? (This would include a severe allergic reaction or a reaction that caused hives, swelling, or respiratory distress, including wheezing.)   No    Do you have a history of any of the following:  Myocarditis or Pericarditis No  Dermal fillers:  No  Multisystem Inflammatory Syndrome (MIS-C or MIS-A)? No  COVID-19 disease within the past 3 months? No  Vaccinated with monkeypox vaccine in the last 4 weeks? No   Pfizer BV given IM left deltoid; tolerated well.  Declined VIS.  Cherlynn Polo, RN

## 2021-12-28 ENCOUNTER — Ambulatory Visit (LOCAL_COMMUNITY_HEALTH_CENTER): Payer: Self-pay

## 2021-12-28 DIAGNOSIS — Z111 Encounter for screening for respiratory tuberculosis: Secondary | ICD-10-CM

## 2021-12-28 LAB — TB SKIN TEST
Induration: 0 mm
TB Skin Test: NEGATIVE

## 2021-12-28 NOTE — Progress Notes (Signed)
PPD 2ND step reading was negative, M.Keyante Durio, LPN

## 2022-01-08 ENCOUNTER — Ambulatory Visit (LOCAL_COMMUNITY_HEALTH_CENTER): Payer: Medicaid Other

## 2022-01-08 DIAGNOSIS — Z111 Encounter for screening for respiratory tuberculosis: Secondary | ICD-10-CM

## 2022-01-08 NOTE — Progress Notes (Signed)
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  Had previous PPD 10/2021 and 12/25/21, but too much time lapsed between these ppd's in order to be considered 1st and 2nd step. Therefore, the 12/25/21 ppd is now considered the 1st step and today (01/08/22) is considered the 2nd step. Jerel Shepherd, RN

## 2022-01-11 ENCOUNTER — Ambulatory Visit (LOCAL_COMMUNITY_HEALTH_CENTER): Payer: Medicaid Other

## 2022-01-11 ENCOUNTER — Other Ambulatory Visit: Payer: Self-pay

## 2022-01-11 DIAGNOSIS — Z111 Encounter for screening for respiratory tuberculosis: Secondary | ICD-10-CM

## 2022-01-11 LAB — TB SKIN TEST
Induration: 0 mm
TB Skin Test: NEGATIVE

## 2022-02-26 ENCOUNTER — Ambulatory Visit (INDEPENDENT_AMBULATORY_CARE_PROVIDER_SITE_OTHER): Payer: Medicaid Other

## 2022-02-26 ENCOUNTER — Encounter: Payer: Self-pay | Admitting: Emergency Medicine

## 2022-02-26 ENCOUNTER — Ambulatory Visit
Admission: EM | Admit: 2022-02-26 | Discharge: 2022-02-26 | Disposition: A | Discharge status: Home / Self Care | Payer: Medicaid Other | Attending: Physician Assistant | Admitting: Physician Assistant

## 2022-02-26 ENCOUNTER — Other Ambulatory Visit: Payer: Self-pay

## 2022-02-26 DIAGNOSIS — S63501A Unspecified sprain of right wrist, initial encounter: Secondary | ICD-10-CM | POA: Insufficient documentation

## 2022-02-26 DIAGNOSIS — N760 Acute vaginitis: Secondary | ICD-10-CM | POA: Diagnosis not present

## 2022-02-26 DIAGNOSIS — M7989 Other specified soft tissue disorders: Secondary | ICD-10-CM | POA: Diagnosis not present

## 2022-02-26 DIAGNOSIS — M25531 Pain in right wrist: Secondary | ICD-10-CM | POA: Insufficient documentation

## 2022-02-26 DIAGNOSIS — Z113 Encounter for screening for infections with a predominantly sexual mode of transmission: Secondary | ICD-10-CM | POA: Insufficient documentation

## 2022-02-26 DIAGNOSIS — B9689 Other specified bacterial agents as the cause of diseases classified elsewhere: Secondary | ICD-10-CM | POA: Insufficient documentation

## 2022-02-26 LAB — URINALYSIS, ROUTINE W REFLEX MICROSCOPIC
Bilirubin Urine: NEGATIVE
Glucose, UA: NEGATIVE mg/dL
Hgb urine dipstick: NEGATIVE
Ketones, ur: NEGATIVE mg/dL
Leukocytes,Ua: NEGATIVE
Nitrite: NEGATIVE
Protein, ur: NEGATIVE mg/dL
Specific Gravity, Urine: 1.02 (ref 1.005–1.030)
pH: 7.5 (ref 5.0–8.0)

## 2022-02-26 LAB — WET PREP, GENITAL
Sperm: NONE SEEN
Trich, Wet Prep: NONE SEEN
WBC, Wet Prep HPF POC: <10 — AB (ref <10)
Yeast Wet Prep HPF POC: NONE SEEN

## 2022-02-26 MED ORDER — METRONIDAZOLE 0.75 % VA GEL: 1.0000 | Freq: Every day | VAGINAL | 0 refills | Status: AC
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
## 2022-02-26 NOTE — Discharge Instructions (Addendum)
SPRAIN: Stressed avoiding painful activities . Reviewed RICE guidelines. Use medications as directed, including NSAIDs. If no NSAIDs have been prescribed for you today, you may take Aleve or Motrin over the counter. May use Tylenol in between doses of NSAIDs.  If no improvement in the next 1-2 weeks, f/u with PCP or return to our office for reexamination, and please feel free to call or return at any time for any questions or concerns you may have and we will be happy to help you!     

## 2022-02-26 NOTE — ED Triage Notes (Signed)
Pt c/o right wrist pain, and swelling. Started about 3 days ago.  She also presents for STD testing. She states she has lower abdominal pain and vaginal discharge.

## 2022-02-27 LAB — CERVICOVAGINAL ANCILLARY ONLY
Chlamydia: NEGATIVE
Comment: NEGATIVE
Comment: NORMAL
Neisseria Gonorrhea: NEGATIVE

## 2022-03-08 ENCOUNTER — Ambulatory Visit: Payer: Medicaid Other

## 2022-03-08 ENCOUNTER — Ambulatory Visit (LOCAL_COMMUNITY_HEALTH_CENTER): Payer: Medicaid Other

## 2022-03-08 VITALS — BP 111/83 | Ht 67.0 in | Wt 169.0 lb

## 2022-03-08 DIAGNOSIS — Z309 Encounter for contraceptive management, unspecified: Secondary | ICD-10-CM | POA: Diagnosis not present

## 2022-03-08 DIAGNOSIS — Z3042 Encounter for surveillance of injectable contraceptive: Secondary | ICD-10-CM

## 2022-03-08 DIAGNOSIS — Z3009 Encounter for other general counseling and advice on contraception: Secondary | ICD-10-CM

## 2022-03-08 NOTE — Progress Notes (Signed)
12 weeks 4 days post Depo.  Voices no concerns.  Light spotting the last month. Medroxyprogesterone Acetate 150 mg. Given per order by C.[place], [place] dated 04/06/2021. Tolerated well administered in right deltoid. Patient reminded to also make appointment for physical at time of next Depo due 05/24/2022.

## 2022-04-29 ENCOUNTER — Emergency Department: Payer: Medicaid Other

## 2022-04-29 ENCOUNTER — Encounter: Payer: Self-pay | Admitting: Emergency Medicine

## 2022-04-29 ENCOUNTER — Emergency Department
Admission: EM | Admit: 2022-04-29 | Discharge: 2022-04-29 | Disposition: A | Discharge status: Home / Self Care | Payer: Medicaid Other | Attending: Emergency Medicine | Admitting: Emergency Medicine

## 2022-04-29 ENCOUNTER — Other Ambulatory Visit: Payer: Self-pay

## 2022-04-29 DIAGNOSIS — S29001A Unspecified injury of muscle and tendon of front wall of thorax, initial encounter: Secondary | ICD-10-CM | POA: Diagnosis present

## 2022-04-29 DIAGNOSIS — S20219A Contusion of unspecified front wall of thorax, initial encounter: Secondary | ICD-10-CM | POA: Insufficient documentation

## 2022-04-29 DIAGNOSIS — R079 Chest pain, unspecified: Secondary | ICD-10-CM | POA: Diagnosis not present

## 2022-04-29 DIAGNOSIS — Y92410 Unspecified street and highway as the place of occurrence of the external cause: Secondary | ICD-10-CM | POA: Insufficient documentation

## 2022-04-29 LAB — URINALYSIS, ROUTINE W REFLEX MICROSCOPIC
Bilirubin Urine: NEGATIVE
Glucose, UA: NEGATIVE mg/dL
Hgb urine dipstick: NEGATIVE
Ketones, ur: NEGATIVE mg/dL
Leukocytes,Ua: NEGATIVE
Nitrite: NEGATIVE
Protein, ur: NEGATIVE mg/dL
Specific Gravity, Urine: 1.021 (ref 1.005–1.030)
pH: 6 (ref 5.0–8.0)

## 2022-04-29 LAB — POC URINE PREG, ED: Preg Test, Ur: NEGATIVE

## 2022-04-29 MED ORDER — MELOXICAM 15 MG PO TABS: 15.0000 mg | ORAL_TABLET | Freq: Every day | ORAL | 0 refills | Status: DC

## 2022-04-29 NOTE — ED Notes (Signed)
Poct pregnancy Negative 
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
## 2022-05-17 ENCOUNTER — Ambulatory Visit
Admission: EM | Admit: 2022-05-17 | Discharge: 2022-05-17 | Disposition: A | Discharge status: Home / Self Care | Payer: Medicaid Other | Attending: Family Medicine | Admitting: Family Medicine

## 2022-05-17 DIAGNOSIS — J189 Pneumonia, unspecified organism: Secondary | ICD-10-CM | POA: Diagnosis not present

## 2022-05-17 DIAGNOSIS — J329 Chronic sinusitis, unspecified: Secondary | ICD-10-CM | POA: Diagnosis not present

## 2022-05-17 DIAGNOSIS — J40 Bronchitis, not specified as acute or chronic: Secondary | ICD-10-CM | POA: Diagnosis not present

## 2022-05-17 DIAGNOSIS — B3731 Acute candidiasis of vulva and vagina: Secondary | ICD-10-CM

## 2022-05-17 DIAGNOSIS — N760 Acute vaginitis: Secondary | ICD-10-CM

## 2022-05-17 DIAGNOSIS — B9689 Other specified bacterial agents as the cause of diseases classified elsewhere: Secondary | ICD-10-CM | POA: Diagnosis not present

## 2022-05-17 DIAGNOSIS — H00024 Hordeolum internum left upper eyelid: Secondary | ICD-10-CM | POA: Diagnosis not present

## 2022-05-17 LAB — URINALYSIS, ROUTINE W REFLEX MICROSCOPIC
Bilirubin Urine: NEGATIVE
Glucose, UA: NEGATIVE mg/dL
Hgb urine dipstick: NEGATIVE
Ketones, ur: NEGATIVE mg/dL
Leukocytes,Ua: NEGATIVE
Nitrite: NEGATIVE
Protein, ur: NEGATIVE mg/dL
Specific Gravity, Urine: 1.015 (ref 1.005–1.030)
pH: 8 (ref 5.0–8.0)

## 2022-05-17 LAB — WET PREP, GENITAL
Sperm: NONE SEEN
Trich, Wet Prep: NONE SEEN
WBC, Wet Prep HPF POC: <10 — AB (ref <10)

## 2022-05-17 MED ORDER — METRONIDAZOLE 0.75 % VA GEL: 1.0000 | Freq: Every day | VAGINAL | 0 refills | Status: AC

## 2022-05-17 MED ORDER — FLUCONAZOLE 150 MG PO TABS: 150.0000 mg | ORAL_TABLET | ORAL | 0 refills | Status: AC

## 2022-05-17 MED ORDER — CEFDINIR 300 MG PO CAPS: 300.0000 mg | ORAL_CAPSULE | Freq: Two times a day (BID) | ORAL | 0 refills | Status: AC

## 2022-05-17 NOTE — ED Triage Notes (Signed)
Patient reports that she had a stye a few weeks ago and there is a bump on her eye lid now. .   Patient c/o congestion - started about a month ago.   Patient reports that she would also like to be checked for BV.

## 2022-05-17 NOTE — Discharge Instructions (Addendum)
Stop by the pharmacy to pick up your prescriptions and take as prescribed.  Follow up with your primary care provider as needed.
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
## 2022-06-06 ENCOUNTER — Ambulatory Visit (LOCAL_COMMUNITY_HEALTH_CENTER): Payer: Medicaid Other

## 2022-06-06 VITALS — BP 134/63 | Ht 67.0 in | Wt 171.5 lb

## 2022-06-06 DIAGNOSIS — Z309 Encounter for contraceptive management, unspecified: Secondary | ICD-10-CM

## 2022-06-06 DIAGNOSIS — Z3009 Encounter for other general counseling and advice on contraception: Secondary | ICD-10-CM

## 2022-06-06 DIAGNOSIS — Z3042 Encounter for surveillance of injectable contraceptive: Secondary | ICD-10-CM | POA: Diagnosis not present

## 2022-06-06 MED ORDER — MEDROXYPROGESTERONE ACETATE 150 MG/ML IM SUSP
150.0000 mg | Freq: Once | INTRAMUSCULAR | Status: AC
  Administered 2022-06-06: 150 mg via INTRAMUSCULAR

## 2022-06-06 NOTE — Progress Notes (Addendum)
12 weeks 6 days post depo.  States spotting when depo due. Advised try and return for depo 11 weeks.  Headache usually every week and states has always had them since starting depo.  States she thinks she gets them when she is stressed and no recent change in h/a.  Sometimes takes ibuprofen or tylenol for relief and other times no medication. Consulted E. Sciora  CNM and advised increase fluids.  Will followup at annual pe. Daily multivitamin daily.    Is overdue her pe and she made appt today for 07/02/22.  Appt reminder given to pt to call for next depo due 08/22/22.   Depo given IM left deltoid per standing order; tolerated well. Depo consent signed.    Katrina Combs, RN Consulted on the plan of care for this client.  I agree with the documented note and actions taken to provide care for this client.  Hazle Coca, CNM

## 2022-07-01 ENCOUNTER — Ambulatory Visit (INDEPENDENT_AMBULATORY_CARE_PROVIDER_SITE_OTHER): Payer: Medicaid Other

## 2022-07-01 ENCOUNTER — Ambulatory Visit
Admission: EM | Admit: 2022-07-01 | Discharge: 2022-07-01 | Disposition: A | Discharge status: Home / Self Care | Payer: Medicaid Other | Attending: Internal Medicine | Admitting: Internal Medicine

## 2022-07-01 DIAGNOSIS — B9689 Other specified bacterial agents as the cause of diseases classified elsewhere: Secondary | ICD-10-CM | POA: Diagnosis not present

## 2022-07-01 DIAGNOSIS — R1030 Lower abdominal pain, unspecified: Secondary | ICD-10-CM | POA: Diagnosis not present

## 2022-07-01 DIAGNOSIS — Z113 Encounter for screening for infections with a predominantly sexual mode of transmission: Secondary | ICD-10-CM | POA: Diagnosis not present

## 2022-07-01 DIAGNOSIS — N760 Acute vaginitis: Secondary | ICD-10-CM | POA: Insufficient documentation

## 2022-07-01 DIAGNOSIS — H0014 Chalazion left upper eyelid: Secondary | ICD-10-CM

## 2022-07-01 DIAGNOSIS — K5909 Other constipation: Secondary | ICD-10-CM | POA: Diagnosis not present

## 2022-07-01 DIAGNOSIS — N898 Other specified noninflammatory disorders of vagina: Secondary | ICD-10-CM | POA: Diagnosis not present

## 2022-07-01 LAB — URINALYSIS, COMPLETE (UACMP) WITH MICROSCOPIC
Bilirubin Urine: NEGATIVE
Glucose, UA: NEGATIVE mg/dL
Hgb urine dipstick: NEGATIVE
Ketones, ur: NEGATIVE mg/dL
Leukocytes,Ua: NEGATIVE
Nitrite: NEGATIVE
Protein, ur: NEGATIVE mg/dL
Specific Gravity, Urine: 1.02 (ref 1.005–1.030)
pH: 7.5 (ref 5.0–8.0)

## 2022-07-01 LAB — WET PREP, GENITAL
Sperm: NONE SEEN
Trich, Wet Prep: NONE SEEN
WBC, Wet Prep HPF POC: <10 (ref <10)
Yeast Wet Prep HPF POC: NONE SEEN

## 2022-07-01 MED ORDER — METRONIDAZOLE 0.75 % VA GEL: 1.0000 | Freq: Every day | VAGINAL | 0 refills | Status: DC

## 2022-07-01 MED ORDER — METRONIDAZOLE 1 % EX GEL: CUTANEOUS | 0 refills | Status: DC

## 2022-07-01 NOTE — Discharge Instructions (Addendum)
Do hot compresses on your left eye lid for 15 minutes 2-3 times a day for 1 week and follow up with your eye doctor if it does not resolve  We will call you if the STD test comes back positive Get Magnesium citrate bottle and drink all of it tonight to help empty out your colon, if the abdominal pain continues, then go to ER to have further test done.

## 2022-07-01 NOTE — ED Triage Notes (Signed)
Pt reports her urine is darker, a little cloudy, lower abdomen pain for a week and a half now. Didn't take anything for the uti symptoms.  Having some thick white and yellow discharge. And wants std testing.  Pt has a bump on her eye lid. For over a month now.
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
## 2022-07-02 ENCOUNTER — Ambulatory Visit (LOCAL_COMMUNITY_HEALTH_CENTER): Payer: Medicaid Other | Admitting: Nurse Practitioner

## 2022-07-02 VITALS — BP 118/75 | Ht 67.0 in | Wt 172.5 lb

## 2022-07-02 DIAGNOSIS — Z3042 Encounter for surveillance of injectable contraceptive: Secondary | ICD-10-CM

## 2022-07-02 DIAGNOSIS — Z309 Encounter for contraceptive management, unspecified: Secondary | ICD-10-CM | POA: Diagnosis not present

## 2022-07-02 DIAGNOSIS — Z113 Encounter for screening for infections with a predominantly sexual mode of transmission: Secondary | ICD-10-CM

## 2022-07-02 DIAGNOSIS — Z0000 Encounter for general adult medical examination without abnormal findings: Secondary | ICD-10-CM

## 2022-07-02 DIAGNOSIS — Z1331 Encounter for screening for depression: Secondary | ICD-10-CM

## 2022-07-02 DIAGNOSIS — Z3009 Encounter for other general counseling and advice on contraception: Secondary | ICD-10-CM

## 2022-07-02 LAB — HM HIV SCREENING LAB: HM HIV Screening: NEGATIVE

## 2022-07-02 LAB — HM HEPATITIS C SCREENING LAB: HM Hepatitis Screen: NEGATIVE

## 2022-07-02 MED ORDER — MEDROXYPROGESTERONE ACETATE 150 MG/ML IM SUSP
150.0000 mg | INTRAMUSCULAR | Status: DC
  Administered 2022-08-28: 150 mg via INTRAMUSCULAR
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
## 2022-07-02 NOTE — Progress Notes (Unsigned)
Pt here for annual physical examination.  No in house labs done today.  Family planning packet provided.-Collins Scotland, RN

## 2022-07-03 LAB — CERVICOVAGINAL ANCILLARY ONLY
Chlamydia: NEGATIVE
Comment: NEGATIVE
Comment: NEGATIVE
Comment: NORMAL
Neisseria Gonorrhea: NEGATIVE
Trichomonas: NEGATIVE

## 2022-07-17 ENCOUNTER — Ambulatory Visit: Payer: Medicaid Other | Admitting: Licensed Clinical Social Worker
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
## 2022-07-19 ENCOUNTER — Ambulatory Visit
Admission: EM | Admit: 2022-07-19 | Discharge: 2022-07-19 | Disposition: A | Discharge status: Home / Self Care | Payer: Medicaid Other | Attending: Urgent Care | Admitting: Urgent Care

## 2022-07-19 ENCOUNTER — Ambulatory Visit: Admit: 2022-07-19 | Discharge status: Absent without leave | Payer: Medicaid Other

## 2022-07-19 DIAGNOSIS — J019 Acute sinusitis, unspecified: Secondary | ICD-10-CM | POA: Diagnosis not present

## 2022-07-19 DIAGNOSIS — B9689 Other specified bacterial agents as the cause of diseases classified elsewhere: Secondary | ICD-10-CM | POA: Diagnosis not present

## 2022-07-19 MED ORDER — AMOXICILLIN-POT CLAVULANATE 875-125 MG PO TABS: 1.0000 | ORAL_TABLET | Freq: Two times a day (BID) | ORAL | 0 refills | Status: DC

## 2022-07-19 NOTE — Discharge Instructions (Signed)
Follow up here or with your primary care provider if your symptoms are worsening or not improving with treatment.     
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
## 2022-08-05 ENCOUNTER — Ambulatory Visit: Admit: 2022-08-05 | Discharge status: Against Medical Advice | Payer: Medicaid Other

## 2022-08-05 DIAGNOSIS — Z113 Encounter for screening for infections with a predominantly sexual mode of transmission: Secondary | ICD-10-CM | POA: Diagnosis not present

## 2022-08-05 DIAGNOSIS — R350 Frequency of micturition: Secondary | ICD-10-CM | POA: Diagnosis not present

## 2022-08-05 DIAGNOSIS — N760 Acute vaginitis: Secondary | ICD-10-CM | POA: Diagnosis not present

## 2022-08-28 ENCOUNTER — Ambulatory Visit (LOCAL_COMMUNITY_HEALTH_CENTER): Payer: Medicaid Other

## 2022-08-28 VITALS — BP 120/67 | Ht 67.0 in | Wt 175.0 lb

## 2022-08-28 DIAGNOSIS — Z309 Encounter for contraceptive management, unspecified: Secondary | ICD-10-CM

## 2022-08-28 DIAGNOSIS — Z3042 Encounter for surveillance of injectable contraceptive: Secondary | ICD-10-CM

## 2022-08-28 DIAGNOSIS — Z3009 Encounter for other general counseling and advice on contraception: Secondary | ICD-10-CM

## 2022-08-28 NOTE — Progress Notes (Signed)
11 weeks 6 days post depo.  Denies any problems.  Depo given IM right deltoid as ordered by A. White FNP dated 06/06/22; tolerated well. Next depo due 11/13/22; appt reminder given.  Tonny Branch, RN

## 2022-08-30 IMAGING — CR DG FINGER MIDDLE 2+V*L*
3 series · 3 of 3 positions shown · non-contrast
Comparison: 07/20/2021, 07/25/2021, 08/09/2021

CLINICAL DATA: 25-year-old female with history of left third distal
phalangeal fracture. Follow-up study

EXAM:
LEFT MIDDLE FINGER 2+V

[finger ap]
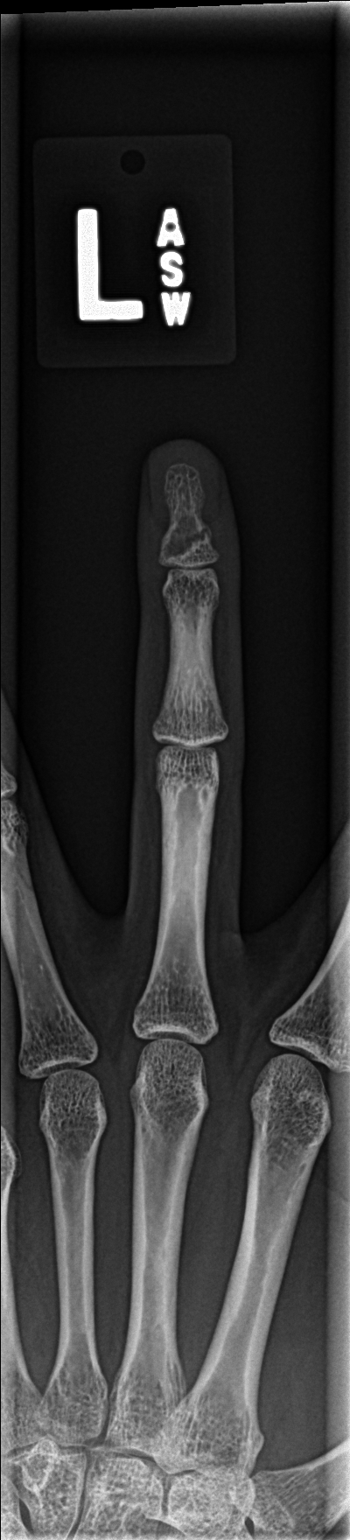

[finger obl]
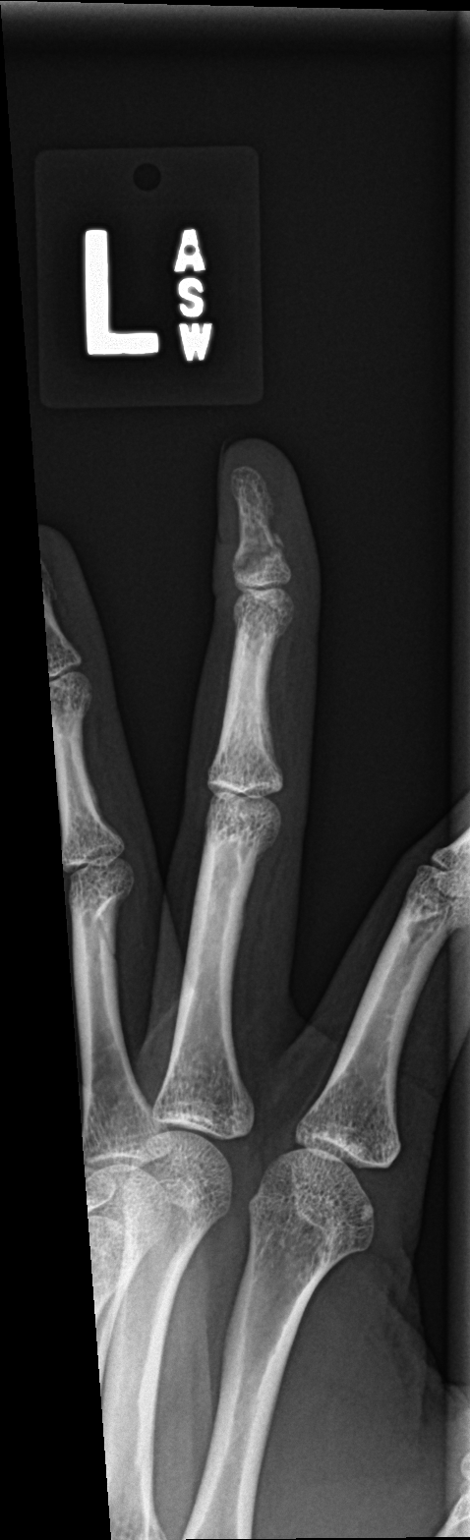

[finger lat]
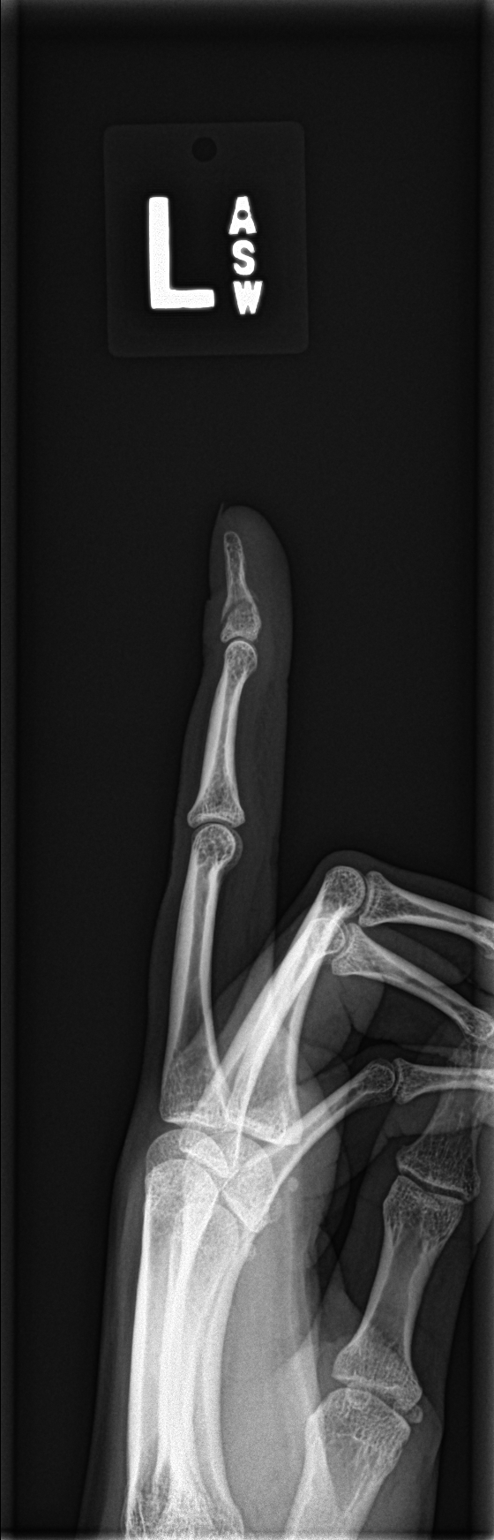

[3 of 3 positions shown; findings below may reference images not displayed]

FINDINGS: Similar appearance of previously visualized nondisplaced distal
third phalangeal fracture with mild dorsal angulation of the distal
fracture fragment. No significant interval callus formation since
comparison. Mild interval decreased soft tissue prominence about the
distal phalanx. No radiopaque foreign bodies.
IMPRESSION: Similar appearing nonhealing, nondisplaced fracture of the left
third distal phalanx.

## 2022-09-06 ENCOUNTER — Encounter: Payer: Self-pay | Admitting: Family Medicine

## 2022-09-06 ENCOUNTER — Ambulatory Visit: Payer: Medicaid Other | Admitting: Family Medicine

## 2022-09-06 VITALS — BP 104/74 | HR 72 | Ht 67.0 in | Wt 179.0 lb

## 2022-09-06 DIAGNOSIS — R1030 Lower abdominal pain, unspecified: Secondary | ICD-10-CM | POA: Diagnosis not present

## 2022-09-06 DIAGNOSIS — K2100 Gastro-esophageal reflux disease with esophagitis, without bleeding: Secondary | ICD-10-CM

## 2022-09-06 DIAGNOSIS — Z124 Encounter for screening for malignant neoplasm of cervix: Secondary | ICD-10-CM

## 2022-09-06 DIAGNOSIS — R3989 Other symptoms and signs involving the genitourinary system: Secondary | ICD-10-CM | POA: Diagnosis not present

## 2022-09-06 DIAGNOSIS — R87612 Low grade squamous intraepithelial lesion on cytologic smear of cervix (LGSIL): Secondary | ICD-10-CM

## 2022-09-06 LAB — POCT URINALYSIS DIPSTICK
Bilirubin, UA: NEGATIVE
Blood, UA: NEGATIVE
Glucose, UA: NEGATIVE
Ketones, UA: NEGATIVE
Leukocytes, UA: NEGATIVE
Nitrite, UA: NEGATIVE
Protein, UA: NEGATIVE
Spec Grav, UA: 1.015 (ref 1.010–1.025)
Urobilinogen, UA: 0.2 E.U./dL
pH, UA: 6.5 (ref 5.0–8.0)

## 2022-09-06 LAB — POCT URINE PREGNANCY: Preg Test, Ur: NEGATIVE

## 2022-09-06 MED ORDER — PANTOPRAZOLE SODIUM 20 MG PO TBEC: 20.0000 mg | DELAYED_RELEASE_TABLET | Freq: Every day | ORAL | 0 refills | Status: DC | PRN

## 2022-09-06 MED ORDER — METRONIDAZOLE 0.75 % EX GEL: CUTANEOUS | 0 refills | Status: DC

## 2022-09-08 LAB — NUSWAB BV AND CANDIDA, NAA
Candida albicans, NAA: NEGATIVE
Candida glabrata, NAA: NEGATIVE

## 2022-09-09 ENCOUNTER — Ambulatory Visit: Payer: Medicaid Other | Admitting: Family Medicine

## 2022-09-09 ENCOUNTER — Encounter: Payer: Self-pay | Admitting: Family Medicine

## 2022-09-09 NOTE — Telephone Encounter (Signed)
Please review.  I don't see a note in the chart for visit.  KP

## 2022-09-10 DIAGNOSIS — K2100 Gastro-esophageal reflux disease with esophagitis, without bleeding: Secondary | ICD-10-CM | POA: Insufficient documentation

## 2022-09-10 LAB — URINE CULTURE

## 2022-09-10 NOTE — Progress Notes (Signed)
Primary Care / Sports Medicine Office Visit  Patient Information:  Patient ID: Katrina Ortega, female DOB: Oct 13, 1995 Age: 27 y.o. MRN: [id number]   Katrina Ortega is a pleasant 27 y.o. female presenting with the following:  Chief Complaint  Patient presents with   Abdominal Pain    Bladder pain, pt went to UC for BV did not complete medication due to medication having bad taste    Constipation    Vitals:   09/06/22 0922  BP: 104/74  Pulse: 72  SpO2: 98%   Vitals:   09/06/22 0922  Weight: 179 lb (81.2 kg)  Height: 5' 7"$  (1.702 m)   Body mass index is 28.04 kg/m.  No results found.   Independent interpretation of notes and tests performed by another provider:   None  Procedures performed:   None  Pertinent History, Exam, Impression, and Recommendations:   Katrina Ortega was seen today for abdominal pain and constipation.  Lower abdominal pain Assessment & Plan: Acute on chronic concern in the setting of irregular bowel habits, normoactive bowel sounds without tenderness at the abdomen, abdomen otherwise soft, no hepatosplenomegaly, no rebound noted.  Concern for constipation, given chronicity IBS-C a possibility, referral to GI placed.  OTC regimen advised, will follow peripherally.  Orders: -     POCT urinalysis dipstick -     Urine Culture -     POCT urine pregnancy -     NuSwab BV and Candida, NAA -     Ambulatory referral to Gastroenterology  Bladder pain Assessment & Plan: Given recent BV diagnosis, recheck ordered today, was able to tolerate p.o. regimen, MetroGel prescribed, will follow labs once available.  Orders: -     POCT urinalysis dipstick -     Urine Culture -     POCT urine pregnancy -     NuSwab BV and Candida, NAA -     metroNIDAZOLE; One applicatorful (5 g containing ~37.5 mg metronidazole) intravaginally twice daily (one being at bedtime) for 5 days  Dispense: 45 g; Refill: 0  Gastroesophageal reflux disease with esophagitis without  hemorrhage Assessment & Plan: Acute on chronic issue, pantoprazole prescribed, referral to GI placed.  Orders: -     Ambulatory referral to Gastroenterology -     Pantoprazole Sodium; Take 1 tablet (20 mg total) by mouth daily as needed for heartburn or indigestion.  Dispense: 30 tablet; Refill: 0  Cervical cancer screening -     Ambulatory referral to Gynecology  Low grade squamous intraepithelial lesion on cytologic smear of cervix (LGSIL) Overview: LSIL-07/2018 [ ]$  rpt pap, hpv early 2021   Assessment & Plan: Previously noted, needs follow-up, gynecology referral placed.      Orders & Medications Meds ordered this encounter  Medications   metroNIDAZOLE (METROGEL) 0.75 % gel    Sig: One applicatorful (5 g containing ~37.5 mg metronidazole) intravaginally twice daily (one being at bedtime) for 5 days    Dispense:  45 g    Refill:  0   pantoprazole (PROTONIX) 20 MG tablet    Sig: Take 1 tablet (20 mg total) by mouth daily as needed for heartburn or indigestion.    Dispense:  30 tablet    Refill:  0   Orders Placed This Encounter  Procedures   Urine Culture   Ambulatory referral to Gastroenterology   Ambulatory referral to Gynecology   POCT urinalysis dipstick   POCT urine pregnancy     Return in about 3 months (around  12/05/2022) for CPE.     Montel Culver, MD, [facility]   Primary Care Sports Medicine Primary Care and Sports Medicine at [facility]

## 2022-09-10 NOTE — Assessment & Plan Note (Signed)
Acute on chronic issue, pantoprazole prescribed, referral to GI placed.

## 2022-09-10 NOTE — Assessment & Plan Note (Signed)
Acute on chronic concern in the setting of irregular bowel habits, normoactive bowel sounds without tenderness at the abdomen, abdomen otherwise soft, no hepatosplenomegaly, no rebound noted.  Concern for constipation, given chronicity IBS-C a possibility, referral to GI placed.  OTC regimen advised, will follow peripherally.

## 2022-09-10 NOTE — Assessment & Plan Note (Signed)
Previously noted, needs follow-up, gynecology referral placed.

## 2022-09-10 NOTE — Patient Instructions (Signed)
-   Start probiotic - Can use senna-docusate 1-2 times daily to maintain bowels - Reviewed information attached - Follow-up with gastroenterology and gynecology

## 2022-09-10 NOTE — Assessment & Plan Note (Signed)
Given recent BV diagnosis, recheck ordered today, was able to tolerate p.o. regimen, MetroGel prescribed, will follow labs once available.

## 2022-09-13 DIAGNOSIS — H5213 Myopia, bilateral: Secondary | ICD-10-CM | POA: Diagnosis not present

## 2022-10-06 ENCOUNTER — Other Ambulatory Visit: Payer: Self-pay | Admitting: Family Medicine

## 2022-10-06 DIAGNOSIS — K2100 Gastro-esophageal reflux disease with esophagitis, without bleeding: Secondary | ICD-10-CM
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
## 2022-10-07 ENCOUNTER — Encounter: Payer: Medicaid Other | Admitting: Obstetrics and Gynecology

## 2022-10-07 DIAGNOSIS — Z01419 Encounter for gynecological examination (general) (routine) without abnormal findings: Secondary | ICD-10-CM

## 2022-10-07 DIAGNOSIS — Z124 Encounter for screening for malignant neoplasm of cervix: Secondary | ICD-10-CM
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
## 2022-10-07 NOTE — Telephone Encounter (Signed)
Requested medication (s) are due for refill today: yes  Requested medication (s) are on the active medication list: yes  Last refill:  09/06/22 #30 0 refills  Future visit scheduled: yes in 2 months   Notes to clinic:  no refills remain. Do you want to refill Rx?     Requested Prescriptions  Pending Prescriptions Disp Refills   pantoprazole (PROTONIX) 20 MG tablet [Pharmacy Med Name: PANTOPRAZOLE '20MG'$  TABLETS] 30 tablet 0    Sig: TAKE 1 TABLET(20 MG) BY MOUTH DAILY AS NEEDED FOR HEARTBURN OR INDIGESTION     Gastroenterology: Proton Pump Inhibitors Passed - 10/06/2022  9:04 AM      Passed - Valid encounter within last 12 months    Recent Outpatient Visits           1 month ago Lower abdominal pain   [facility] [place], Earley Abide, MD   1 year ago Bacterial vaginosis   [facility] [place], Earley Abide, MD   1 year ago Closed nondisplaced fracture of distal phalanx of left middle finger with routine healing, subsequent encounter   [place] [facility] [place], Earley Abide, MD   1 year ago Closed nondisplaced fracture of distal phalanx of left middle finger, initial encounter   [facility] [place], Earley Abide, MD   1 year ago Lumbosacral spondylosis with radiculopathy   [facility] at [facility], Earley Abide, MD       Future Appointments             In 2 months Zigmund Daniel, Earley Abide, MD [place] at [facility], [facility]

## 2022-10-08 ENCOUNTER — Ambulatory Visit: Payer: Medicaid Other | Admitting: Obstetrics and Gynecology

## 2022-10-08 ENCOUNTER — Other Ambulatory Visit (HOSPITAL_COMMUNITY)
Admission: RE | Admit: 2022-10-08 | Discharge: 2022-10-08 | Disposition: A | Discharge status: Home / Self Care | Payer: Medicaid Other | Source: Ambulatory Visit | Attending: Obstetrics and Gynecology | Admitting: Obstetrics and Gynecology

## 2022-10-08 ENCOUNTER — Encounter: Payer: Self-pay | Admitting: Obstetrics and Gynecology

## 2022-10-08 VITALS — BP 102/60 | Ht 67.0 in | Wt 181.0 lb

## 2022-10-08 DIAGNOSIS — N898 Other specified noninflammatory disorders of vagina: Secondary | ICD-10-CM | POA: Diagnosis not present

## 2022-10-08 DIAGNOSIS — Z01411 Encounter for gynecological examination (general) (routine) with abnormal findings: Secondary | ICD-10-CM

## 2022-10-08 DIAGNOSIS — Z124 Encounter for screening for malignant neoplasm of cervix: Secondary | ICD-10-CM | POA: Diagnosis not present

## 2022-10-08 DIAGNOSIS — Z3042 Encounter for surveillance of injectable contraceptive: Secondary | ICD-10-CM

## 2022-10-08 DIAGNOSIS — Z01419 Encounter for gynecological examination (general) (routine) without abnormal findings: Secondary | ICD-10-CM

## 2022-10-08 LAB — POCT WET PREP WITH KOH
Clue Cells Wet Prep HPF POC: NEGATIVE
KOH Prep POC: NEGATIVE
Trichomonas, UA: NEGATIVE
Yeast Wet Prep HPF POC: NEGATIVE

## 2022-10-08 MED ORDER — MEDROXYPROGESTERONE ACETATE 150 MG/ML IM SUSP
150.0000 mg | INTRAMUSCULAR | Status: AC
  Administered 2022-11-19 – 2023-02-11 (×2): 150 mg via INTRAMUSCULAR

## 2022-10-08 NOTE — Patient Instructions (Signed)
I value your feedback and you entrusting us with your care. If you get a [facility] patient survey, I would appreciate you taking the time to let us know about your experience today. Thank you! ? ? ?

## 2022-10-14 LAB — CYTOLOGY - PAP
Comment: NEGATIVE
Diagnosis: UNDETERMINED — AB
High risk HPV: POSITIVE — AB

## 2022-11-01 ENCOUNTER — Encounter: Payer: Self-pay | Admitting: Obstetrics and Gynecology

## 2022-11-04 NOTE — Telephone Encounter (Signed)
Spoke with pt. Questions answered. Pls call pt to schedule colpo with MD. She is aware. If can do depo shot at same time, then pls schedule with nurse visit. If not, then she can keep her 11/19/22 nurse appt. Thx.

## 2022-11-05 NOTE — Telephone Encounter (Signed)
I contacted this patient via phone. She is scheduled for 5/7 with Dr. Valentino Saxon

## 2022-11-09 IMAGING — CR DG FINGER MIDDLE 2+V*L*
3 series · 3 of 3 positions shown · non-contrast
Comparison: Most recent radiograph 09/11/2021, most remote
radiograph 07/20/2021

CLINICAL DATA: Closed nondisplaced fracture of distal phalanx of
left middle finger with routine healing, subsequent encounter.
Closed middle finger fracture, left. Fracture in Monday June, 2021.

EXAM:
LEFT MIDDLE FINGER 2+V

[finger ap]
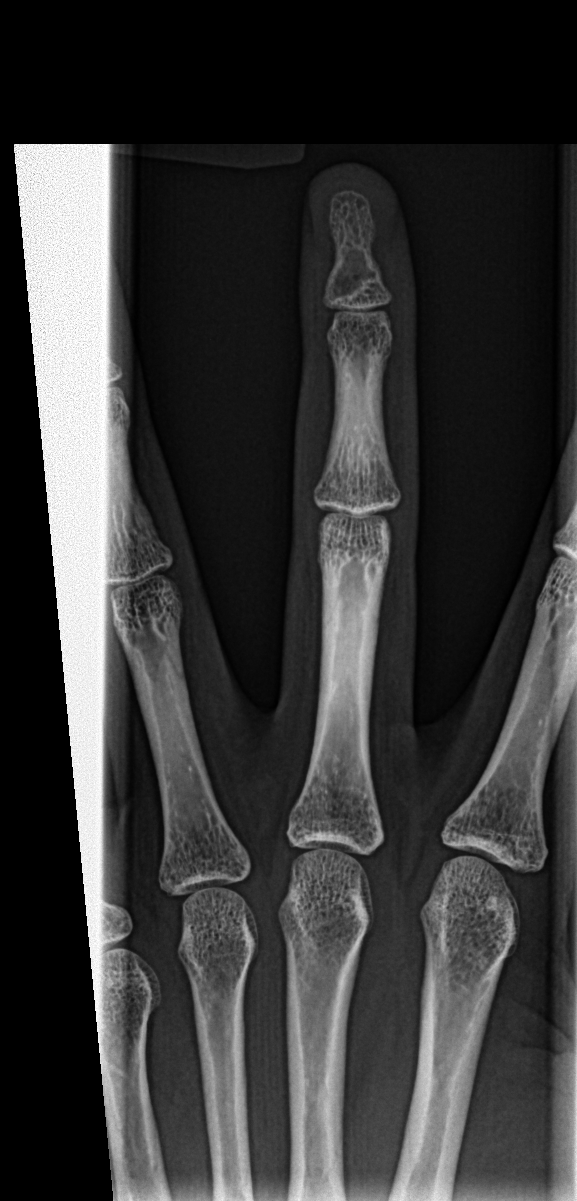

[finger obl]
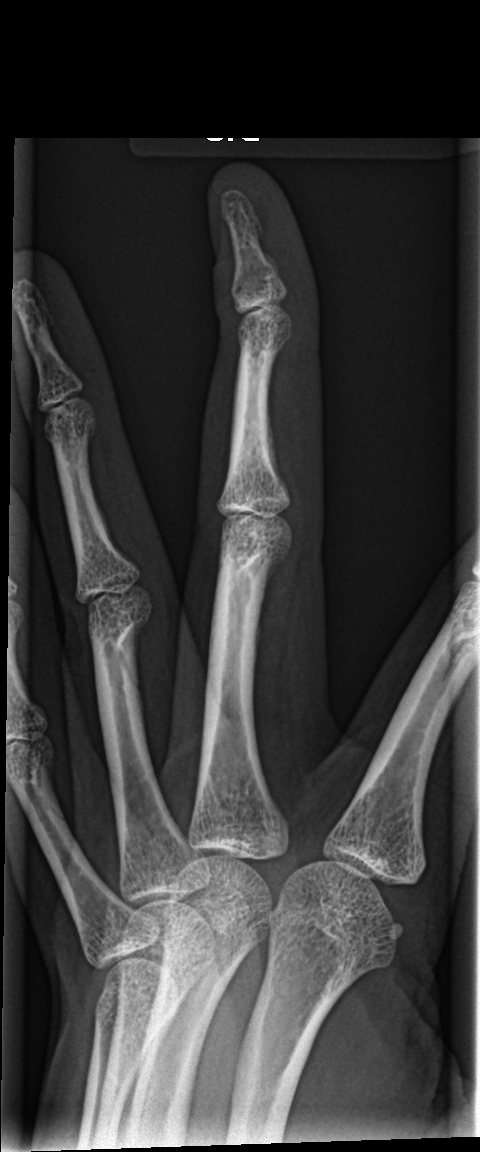

[finger lat]
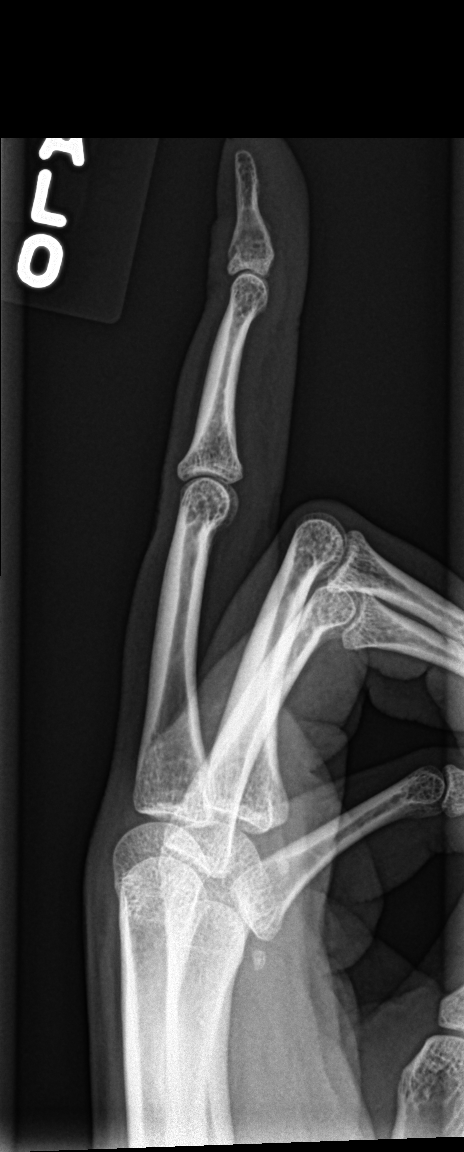

[3 of 3 positions shown; findings below may reference images not displayed]

FINDINGS: The previous oblique distal phalanx fracture is no longer seen,
interval healing. Some bridging callus formation persists. There may
be an underlying 6 mm lucent lesion at the site of fracture, present
dating back to initial injury exams. More proximal digit is intact
and normal.
IMPRESSION: Healed distal phalanx fracture. Fracture line is no longer seen.
There may be an underlying 6 mm lucent lesion at the site of
fracture, in this location appearance typical of benign enchondroma.

## 2022-11-15 NOTE — Patient Instructions (Incomplete)
Medroxyprogesterone Injection (Contraception) What is this medication? MEDROXYPROGESTERONE (me DROX ee proe JES te rone) prevents ovulation and pregnancy. It belongs to a group of medications called contraceptives. This medication is a progestin hormone. This medicine may be used for other purposes; ask your health care provider or pharmacist if you have questions. COMMON BRAND NAME(S): Depo-Provera, Depo-subQ Provera 104 What should I tell my care team before I take this medication? They need to know if you have any of these conditions: Asthma Blood clots Breast cancer or family history of breast cancer Depression Diabetes Eating disorder (anorexia nervosa) Frequently drink alcohol Heart attack High blood pressure HIV infection or AIDS Kidney disease Liver disease Migraine headaches Osteoporosis, weak bones Seizures Stroke Tobacco use Vaginal bleeding An unusual or allergic reaction to medroxyprogesterone, other medications, foods, dyes, or preservatives Pregnant or trying to get pregnant Breast-feeding How should I use this medication? Depo-Provera CI contraceptive injection is given into a muscle. Depo-subQ Provera 104 injection is given under the skin. It is given in a hospital or clinic setting. The injection is usually given during the first 5 days after the start of a menstrual period or 6 weeks after delivery of a baby. A patient package insert for the product will be given with each prescription and refill. Be sure to read this information carefully each time. The sheet may change often. Talk to your care team about the use of this medication in children. Special care may be needed. These injections have been used in female children who have started having menstrual periods. Overdosage: If you think you have taken too much of this medicine contact a poison control center or emergency room at once. NOTE: This medicine is only for you. Do not share this medicine with  others. What if I miss a dose? Keep appointments for follow-up doses. You must get an injection once every 3 months. It is important not to miss your dose. Call your care team if you are unable to keep an appointment. What may interact with this medication? Antibiotics or medications for infections, especially rifampin and griseofulvin Antivirals for HIV or hepatitis Aprepitant Armodafinil Bexarotene Bosentan Medications for seizures, such as carbamazepine, felbamate, oxcarbazepine, phenytoin, phenobarbital, primidone, topiramate Mitotane Modafinil St. John's Wort This list may not describe all possible interactions. Give your health care provider a list of all the medicines, herbs, non-prescription drugs, or dietary supplements you use. Also tell them if you smoke, drink alcohol, or use illegal drugs. Some items may interact with your medicine. What should I watch for while using this medication? This medication does not protect you against HIV infection (AIDS) or other sexually transmitted diseases. Use of this product may cause you to lose calcium from your bones. Loss of calcium may cause weak bones (osteoporosis). Only use this product for more than 2 years if other forms of birth control are not right for you. The longer you use this product for birth control the more likely you will be at risk for weak bones. Ask your care team how you can keep strong bones. You may have a change in bleeding pattern or irregular periods. Many females stop having periods while taking this medication. If you have received your injections on time, your chance of being pregnant is very low. If you think you may be pregnant, see your care team as soon as possible. Tell your care team if you want to get pregnant within the next year. The effect of this medication may last a long time   after you get your last injection. What side effects may I notice from receiving this medication? Side effects that you should  report to your care team as soon as possible: Allergic reactions--skin rash, itching, hives, swelling of the face, lips, tongue, or throat Blood clot--pain, swelling, or warmth in the leg, shortness of breath, chest pain Gallbladder problems--severe stomach pain, nausea, vomiting, fever Increase in blood pressure Liver injury--right upper belly pain, loss of appetite, nausea, light-colored stool, dark yellow or brown urine, yellowing skin or eyes, unusual weakness or fatigue New or worsening migraines or headaches Seizures Stroke--sudden numbness or weakness of the face, arm, or leg, trouble speaking, confusion, trouble walking, loss of balance or coordination, dizziness, severe headache, change in vision Unusual vaginal discharge, itching, or odor Worsening mood, feelings of depression Side effects that usually do not require medical attention (report to your care team if they continue or are bothersome): Breast pain or tenderness Dark patches of the skin on the face or other sun-exposed areas Irregular menstrual cycles or spotting Nausea Weight gain This list may not describe all possible side effects. Call your doctor for medical advice about side effects. You may report side effects to FDA at 1-800-FDA-1088. Where should I keep my medication? This injection is only given by a care team. It will not be stored at home. NOTE: This sheet is a summary. It may not cover all possible information. If you have questions about this medicine, talk to your doctor, pharmacist, or health care provider.  2023 Elsevier/Gold Standard (2020-09-17 00:00:00)  Bacterial Vaginosis  Bacterial vaginosis is an infection of the vagina. It happens when too many normal germs (healthy bacteria) grow in the vagina. This infection can make it easier to get other infections from sex (STIs). It is very important for pregnant women to get treated. This infection can cause babies to be born early or at a low birth  weight. What are the causes? This infection is caused by an increase in certain germs that grow in the vagina. You cannot get this infection from toilet seats, bedsheets, swimming pools, or things that touch your vagina. What increases the risk? Having sex with a new person or more than one person. Having sex without protection. Douching. Having an intrauterine device (IUD). Smoking. Using drugs or drinking alcohol. These can lead you to do things that are risky. Taking certain antibiotic medicines. Being pregnant. What are the signs or symptoms? Some women have no symptoms. Symptoms may include: A discharge from your vagina. It may be gray or white. It can be watery or foamy. A fishy smell. This can happen after sex or during your menstrual period. Itching in and around your vagina. A feeling of burning or pain when you pee (urinate). How is this treated? This infection is treated with antibiotic medicines. These may be given to you as: A pill. A cream for your vagina. A medicine that you put into your vagina (suppository). If the infection comes back after treatment, you may need more antibiotics. Follow these instructions at home: Medicines Take over-the-counter and prescription medicines as told by your doctor. Take or use your antibiotic medicine as told by your doctor. Do not stop taking or using it, even if you start to feel better. General instructions If the person you have sex with is a woman, tell her that you have this infection. She will need to follow up with her doctor. If you have a female partner, he does not need to be treated. Do   not have sex until you finish treatment. Drink enough fluid to keep your pee pale yellow. Keep your vagina and butt clean. Wash the area with warm water each day. Wipe from front to back after you use the toilet. If you are breastfeeding a baby, ask your doctor if you should keep doing so during treatment. Keep all follow-up visits. How  is this prevented? Self-care Do not douche. Use only warm water to wash around your vagina. Wear underwear that is cotton or lined with cotton. Do not wear tight pants and pantyhose, especially in the summer. Safe sex Use protection when you have sex. This includes: Use condoms. Use dental dams. This is a thin layer that protects the mouth during oral sex. Limit how many people you have sex with. To prevent this infection, it is best to have sex with just one person. Get tested for STIs. The person you have sex with should also get tested. Drugs and alcohol Do not smoke or use any products that contain nicotine or tobacco. If you need help quitting, ask your doctor. Do not use drugs. Do not drink alcohol if: Your doctor tells you not to drink. You are pregnant, may be pregnant, or are planning to become pregnant. If you drink alcohol: Limit how much you have to 0-1 drink a day. Know how much alcohol is in your drink. In the [place] one drink equals one 12 oz bottle of beer (355 mL), one 5 oz glass of wine (148 mL), or one 1 oz glass of hard liquor (44 mL). Where to find more information Centers for Disease Control and Prevention: www.cdc.gov American Sexual Health Association: www.ashastd.org Office on Women's Health: www.womenshealth.gov Contact a doctor if: Your symptoms do not get better, even after you are treated. You have more discharge or pain when you pee. You have a fever or chills. You have pain in your belly (abdomen) or in the area between your hips. You have pain with sex. You bleed from your vagina between menstrual periods. Summary This infection can happen when too many germs (bacteria) grow in the vagina. This infection can make it easier to get infections from sex (STIs). Treating this can lower that chance. Get treated if you are pregnant. This infection can cause babies to be born early. Do not stop taking or using your antibiotic medicine, even if you start to  feel better. This information is not intended to replace advice given to you by your health care provider. Make sure you discuss any questions you have with your health care provider. Document Revised: 01/13/2020 Document Reviewed: 01/13/2020 Elsevier Patient Education  2023 Elsevier Inc.  

## 2022-11-15 NOTE — Progress Notes (Signed)
    NURSE VISIT NOTE  Subjective:    Patient ID: Katrina Ortega, female    DOB: 1996-06-16, 27 y.o.   MRN: [id number]  HPI  Patient is a 27 y.o. G72P1001 female who presents for depo provera injection. She requests to repeat a self swab today to check for BV. She has h/o recurrent BV. She is not currently having any symptoms.   Objective:    BP 119/69   Pulse 84   Ht 5\' 7"  (1.702 m)   Wt 178 lb 11.2 oz (81.1 kg)   BMI 27.99 kg/m   Last Annual: 10/08/22. Last pap: 10/08/22. Last Depo-Provera: 08/28/22. Side Effects if any: none. Serum HCG indicated? No . Depo-Provera 150 mg IM given by: Rocco Serene, LPN. Site: Left Deltoid  Lab Review    Assessment:   1. Encounter for surveillance of injectable contraceptive      Plan:   Next appointment due between 7/9 - 02/18/23. GC and chlamydia DNA  probe sent to lab per patient request.   Rocco Serene, LPN

## 2022-11-19 ENCOUNTER — Other Ambulatory Visit (HOSPITAL_COMMUNITY)
Admission: RE | Admit: 2022-11-19 | Discharge: 2022-11-19 | Disposition: A | Discharge status: Home / Self Care | Payer: Medicaid Other | Source: Ambulatory Visit | Attending: Obstetrics and Gynecology | Admitting: Obstetrics and Gynecology

## 2022-11-19 ENCOUNTER — Ambulatory Visit (INDEPENDENT_AMBULATORY_CARE_PROVIDER_SITE_OTHER): Payer: Medicaid Other

## 2022-11-19 VITALS — BP 119/69 | HR 84 | Ht 67.0 in | Wt 178.7 lb

## 2022-11-19 DIAGNOSIS — Z3042 Encounter for surveillance of injectable contraceptive: Secondary | ICD-10-CM | POA: Diagnosis not present

## 2022-11-19 DIAGNOSIS — N898 Other specified noninflammatory disorders of vagina: Secondary | ICD-10-CM | POA: Diagnosis not present

## 2022-11-20 LAB — CERVICOVAGINAL ANCILLARY ONLY
Bacterial Vaginitis (gardnerella): NEGATIVE
Candida Glabrata: NEGATIVE
Candida Vaginitis: NEGATIVE
Chlamydia: NEGATIVE
Comment: NEGATIVE
Comment: NEGATIVE
Comment: NEGATIVE
Comment: NEGATIVE
Comment: NEGATIVE
Comment: NORMAL
Neisseria Gonorrhea: NEGATIVE
Trichomonas: NEGATIVE

## 2022-12-02 NOTE — Progress Notes (Unsigned)
    GYNECOLOGY OFFICE COLPOSCOPY PROCEDURE NOTE  27 y.o. G1P1001 here for colposcopy for ASCUS with POSITIVE high risk HPV pap smear on 10/08/2022. Discussed role for HPV in cervical dysplasia, need for surveillance.  Patient gave informed written consent, time out was performed.  Placed in lithotomy position. Cervix viewed with speculum and colposcope after application of acetic acid.   Colposcopy adequate? {yes/no:20286}  {Findings; colposcopy:728}; corresponding biopsies obtained.  ECC specimen obtained. All specimens were labeled and sent to pathology.  Chaperone was present during entire procedure.  Patient was given post procedure instructions.  Will follow up pathology and manage accordingly; patient will be contacted with results and recommendations.  Routine preventative health maintenance measures emphasized.    Hildred Laser, MD [facility]

## 2022-12-03 ENCOUNTER — Other Ambulatory Visit (HOSPITAL_COMMUNITY)
Admission: RE | Admit: 2022-12-03 | Discharge: 2022-12-03 | Disposition: A | Discharge status: Home / Self Care | Payer: Medicaid Other | Source: Ambulatory Visit | Attending: Obstetrics and Gynecology | Admitting: Obstetrics and Gynecology

## 2022-12-03 ENCOUNTER — Encounter: Payer: Self-pay | Admitting: Obstetrics and Gynecology

## 2022-12-03 ENCOUNTER — Ambulatory Visit (INDEPENDENT_AMBULATORY_CARE_PROVIDER_SITE_OTHER): Payer: Medicaid Other | Admitting: Obstetrics and Gynecology

## 2022-12-03 VITALS — BP 113/67 | HR 86 | Resp 16 | Ht 67.0 in | Wt 182.3 lb

## 2022-12-03 DIAGNOSIS — R87810 Cervical high risk human papillomavirus (HPV) DNA test positive: Secondary | ICD-10-CM | POA: Insufficient documentation

## 2022-12-03 DIAGNOSIS — R87610 Atypical squamous cells of undetermined significance on cytologic smear of cervix (ASC-US): Secondary | ICD-10-CM | POA: Insufficient documentation

## 2022-12-03 DIAGNOSIS — D260 Other benign neoplasm of cervix uteri: Secondary | ICD-10-CM

## 2022-12-04 LAB — SURGICAL PATHOLOGY

## 2022-12-10 ENCOUNTER — Ambulatory Visit (INDEPENDENT_AMBULATORY_CARE_PROVIDER_SITE_OTHER): Payer: Medicaid Other | Admitting: Family Medicine

## 2022-12-10 ENCOUNTER — Encounter: Payer: Self-pay | Admitting: Family Medicine

## 2022-12-10 VITALS — BP 118/70 | HR 78 | Ht 67.0 in | Wt 185.0 lb

## 2022-12-10 DIAGNOSIS — Z13220 Encounter for screening for lipoid disorders: Secondary | ICD-10-CM | POA: Diagnosis not present

## 2022-12-10 DIAGNOSIS — E559 Vitamin D deficiency, unspecified: Secondary | ICD-10-CM | POA: Diagnosis not present

## 2022-12-10 DIAGNOSIS — Z0000 Encounter for general adult medical examination without abnormal findings: Secondary | ICD-10-CM | POA: Diagnosis not present

## 2022-12-11 ENCOUNTER — Other Ambulatory Visit: Payer: Self-pay | Admitting: Family Medicine

## 2022-12-11 LAB — COMPREHENSIVE METABOLIC PANEL
ALT: 19 IU/L (ref 0–32)
AST: 17 IU/L (ref 0–40)
Albumin/Globulin Ratio: 1.7 (ref 1.2–2.2)
Albumin: 4.3 g/dL (ref 4.0–5.0)
Alkaline Phosphatase: 82 IU/L (ref 44–121)
BUN/Creatinine Ratio: 15 (ref 9–23)
BUN: 11 mg/dL (ref 6–20)
Bilirubin Total: 0.6 mg/dL (ref 0.0–1.2)
CO2: 22 mmol/L (ref 20–29)
Calcium: 9.3 mg/dL (ref 8.7–10.2)
Chloride: 105 mmol/L (ref 96–106)
Creatinine, Ser: 0.72 mg/dL (ref 0.57–1.00)
Globulin, Total: 2.6 g/dL (ref 1.5–4.5)
Glucose: 97 mg/dL (ref 70–99)
Potassium: 3.8 mmol/L (ref 3.5–5.2)
Sodium: 140 mmol/L (ref 134–144)
Total Protein: 6.9 g/dL (ref 6.0–8.5)
eGFR: 117 mL/min/{1.73_m2} (ref >59)

## 2022-12-11 LAB — TSH: TSH: 2.63 u[IU]/mL (ref 0.450–4.500)

## 2022-12-11 LAB — CBC
Hematocrit: 38.8 % (ref 34.0–46.6)
Hemoglobin: 13.3 g/dL (ref 11.1–15.9)
MCH: 29.8 pg (ref 26.6–33.0)
MCHC: 34.3 g/dL (ref 31.5–35.7)
MCV: 87 fL (ref 79–97)
Platelets: 245 10*3/uL (ref 150–450)
RBC: 4.47 x10E6/uL (ref 3.77–5.28)
RDW: 12.2 % (ref 11.7–15.4)
WBC: 8.3 10*3/uL (ref 3.4–10.8)

## 2022-12-11 LAB — LIPID PANEL
Chol/HDL Ratio: 2.9 ratio (ref 0.0–4.4)
Cholesterol, Total: 175 mg/dL (ref 100–199)
HDL: 61 mg/dL (ref >39)
LDL Chol Calc (NIH): 101 mg/dL — ABNORMAL HIGH (ref 0–99)
Triglycerides: 69 mg/dL (ref 0–149)
VLDL Cholesterol Cal: 13 mg/dL (ref 5–40)

## 2022-12-11 LAB — VITAMIN D 25 HYDROXY (VIT D DEFICIENCY, FRACTURES): Vit D, 25-Hydroxy: 18.4 ng/mL — ABNORMAL LOW (ref 30.0–100.0)

## 2022-12-11 MED ORDER — VITAMIN D (ERGOCALCIFEROL) 1.25 MG (50000 UNIT) PO CAPS
50000.0000 [IU] | ORAL_CAPSULE | ORAL | 0 refills | Status: DC
Start: 2022-12-11 — End: 2024-06-15

## 2022-12-17 ENCOUNTER — Encounter: Payer: Self-pay | Admitting: Family Medicine

## 2022-12-17 DIAGNOSIS — Z0000 Encounter for general adult medical examination without abnormal findings: Secondary | ICD-10-CM | POA: Insufficient documentation

## 2022-12-17 NOTE — Assessment & Plan Note (Signed)
Annual examination completed, risk stratification labs ordered, anticipatory guidance provided.  We will follow labs once resulted. 

## 2022-12-17 NOTE — Progress Notes (Signed)
Annual Physical Exam Visit  Patient Information:  Patient ID: Katrina Ortega, female DOB: 03/28/1996 Age: 27 y.o. MRN: [id number]   Subjective:   CC: Annual Physical Exam  HPI:  Katrina Ortega is here for their annual physical.  I reviewed the past medical history, family history, social history, surgical history, and allergies today and changes were made as necessary.  Please see the problem list section below for additional details.  Past Medical History: Past Medical History:  Diagnosis Date   Allergy    Anemia    Anxiety and depression    GERD (gastroesophageal reflux disease)    Group beta Strep positive 08/24/2018   Identified in urine 01/22 Will require prophylaxis in labor   Hypertension    preeclampsia   Medical history non-contributory    Mental disorder    Supervision of other normal pregnancy, antepartum 08/19/2018    Nursing Staff Provider Office Location   Dating   LMP Language   ENG Anatomy US  Normal Flu Vaccine  Declined Genetic Screen  Declined  TDaP vaccine   11/04/2018 Hgb A1C or  GTT Early  Third trimester  Rhogam   N/A: A POS   LAB RESULTS  Feeding Plan  breast Blood Type A/Positive/-- (01/22 1555)  Contraception  Depo Antibody Negative (01/22 1555) Circumcision Yes Rubella 3.68 (01/22 1555) Pediatri   Past Surgical History: Past Surgical History:  Procedure Laterality Date   CESAREAN SECTION N/A 01/22/2019   Procedure: CESAREAN SECTION;  Surgeon: Catalina Antigua, MD;  Location: [facility] LD ORS;  Service: Obstetrics;  Laterality: N/A;   TOENAIL EXCISION Bilateral 2021   Family History: Family History  Problem Relation Age of Onset   Multiple sclerosis Mother    Migraines Mother    Hypertension Father    Migraines Father    Other Father        tumor "in front of brain"   Diabetes Paternal Uncle    Schizophrenia Maternal Grandmother    Diabetes Maternal Grandfather    Diabetes Paternal Grandmother    Hypertension Paternal Grandmother    Eczema  Son    Breast cancer Maternal Great-grandmother        mid to late years   Allergies: Allergies  Allergen Reactions   Percocet [Oxycodone-Acetaminophen] Itching   Health Maintenance: Health Maintenance  Topic Date Due   COVID-19 Vaccine (3 - 2023-24 season) 03/29/2022   INFLUENZA VACCINE  02/27/2023   PAP-Cervical Cytology Screening  10/07/2025   PAP SMEAR-Modifier  10/07/2025   DTaP/Tdap/Td (2 - Td or Tdap) 11/03/2028   HPV VACCINES  Completed   Hepatitis C Screening  Completed   HIV Screening  Completed    HM Colonoscopy     This patient has no relevant Health Maintenance data.      Medications: Current Outpatient Medications on File Prior to Visit  Medication Sig Dispense Refill   cetirizine (ZYRTEC ALLERGY) 10 MG tablet Take 1 tablet (10 mg total) by mouth daily. 30 tablet 2   loratadine (CLARITIN REDITABS) 10 MG dissolvable tablet Take 10 mg by mouth daily.     pantoprazole (PROTONIX) 20 MG tablet TAKE 1 TABLET(20 MG) BY MOUTH DAILY AS NEEDED FOR HEARTBURN OR INDIGESTION 30 tablet 0   Current Facility-Administered Medications on File Prior to Visit  Medication Dose Route Frequency Provider Last Rate Last Admin   medroxyPROGESTERone (DEPO-PROVERA) injection 150 mg  150 mg Intramuscular Q90 days Copland, Alicia B, PA-C   150 mg at 11/19/22 1045  Review of Systems: No headache, visual changes, nausea, vomiting, diarrhea, constipation, dizziness, abdominal pain, skin rash, fevers, chills, night sweats, swollen lymph nodes, weight loss, chest pain, body aches, joint swelling, muscle aches, shortness of breath, mood changes, visual or auditory hallucinations reported.  Objective:   Vitals:   12/10/22 1007  BP: 118/70  Pulse: 78  SpO2: 98%   Vitals:   12/10/22 1007  Weight: 185 lb (83.9 kg)  Height: 5\' 7"  (1.702 m)   Body mass index is 28.98 kg/m.  General: Well Developed, well nourished, and in no acute distress.  Neuro: Alert and oriented x3, extra-ocular  muscles intact, sensation grossly intact. Cranial nerves II through XII are grossly intact, motor, sensory, and coordinative functions are intact. HEENT: Normocephalic, atraumatic, pupils equal round reactive to light, neck supple, no masses, no lymphadenopathy, thyroid nonpalpable. Oropharynx, nasopharynx, external ear canals are unremarkable. Skin: Warm and dry, no rashes noted.  Cardiac: Regular rate and rhythm, no murmurs rubs or gallops. No peripheral edema. Pulses symmetric. Respiratory: Clear to auscultation bilaterally. Not using accessory muscles, speaking in full sentences.  Abdominal: Soft, nontender, nondistended, positive bowel sounds, no masses, no organomegaly. Musculoskeletal: Shoulder, elbow, wrist, hip, knee, ankle stable, and with full range of motion.  Female chaperone initials: AH present throughout the physical examination.  Impression and Recommendations:   The patient was counselled, risk factors were discussed, and anticipatory guidance given.  Problem List Items Addressed This Visit       Other   Annual physical exam   Relevant Orders   CBC (Completed)   Comprehensive metabolic panel (Completed)   Lipid panel (Completed)   TSH (Completed)   VITAMIN D 25 Hydroxy (Vit-D Deficiency, Fractures) (Completed)   Healthcare maintenance - Primary    Annual examination completed, risk stratification labs ordered, anticipatory guidance provided.  We will follow labs once resulted.      Relevant Orders   CBC (Completed)   Comprehensive metabolic panel (Completed)   Lipid panel (Completed)   TSH (Completed)   VITAMIN D 25 Hydroxy (Vit-D Deficiency, Fractures) (Completed)   Other Visit Diagnoses     Vitamin D deficiency       Relevant Orders   VITAMIN D 25 Hydroxy (Vit-D Deficiency, Fractures) (Completed)   Screening for lipoid disorders       Relevant Orders   Comprehensive metabolic panel (Completed)   Lipid panel (Completed)        Orders &  Medications Medications: No orders of the defined types were placed in this encounter.  Orders Placed This Encounter  Procedures   CBC   Comprehensive metabolic panel   Lipid panel   TSH   VITAMIN D 25 Hydroxy (Vit-D Deficiency, Fractures)     No follow-ups on file.    Katrina Banana, MD, [facility]   Primary Care Sports Medicine Primary Care and Sports Medicine at [facility]

## 2022-12-17 NOTE — Patient Instructions (Signed)
-   Obtain fasting labs with orders provided (can have water or black coffee but otherwise no food or drink x 8 hours before labs) °- Review information provided °- Attend eye doctor annually, dentist every 6 months, work towards or maintain 30 minutes of moderate intensity physical activity at least 5 days per week, and consume a balanced diet °- Return in 1 year for physical °- Contact us for any questions between now and then °

## 2023-01-11 DIAGNOSIS — N3001 Acute cystitis with hematuria: Secondary | ICD-10-CM | POA: Diagnosis not present

## 2023-01-11 DIAGNOSIS — R300 Dysuria: Secondary | ICD-10-CM | POA: Diagnosis not present

## 2023-01-11 DIAGNOSIS — Z113 Encounter for screening for infections with a predominantly sexual mode of transmission: Secondary | ICD-10-CM | POA: Diagnosis not present

## 2023-01-11 DIAGNOSIS — N760 Acute vaginitis: Secondary | ICD-10-CM | POA: Diagnosis not present

## 2023-02-04 DIAGNOSIS — R350 Frequency of micturition: Secondary | ICD-10-CM | POA: Diagnosis not present

## 2023-02-04 DIAGNOSIS — S39012A Strain of muscle, fascia and tendon of lower back, initial encounter: Secondary | ICD-10-CM | POA: Diagnosis not present

## 2023-02-08 ENCOUNTER — Encounter: Payer: Self-pay | Admitting: Family Medicine

## 2023-02-11 ENCOUNTER — Ambulatory Visit (INDEPENDENT_AMBULATORY_CARE_PROVIDER_SITE_OTHER): Payer: Medicaid Other

## 2023-02-11 VITALS — BP 130/77 | HR 73 | Ht 67.0 in | Wt 182.3 lb

## 2023-02-11 DIAGNOSIS — Z3042 Encounter for surveillance of injectable contraceptive: Secondary | ICD-10-CM | POA: Diagnosis not present

## 2023-02-11 NOTE — Progress Notes (Signed)
    NURSE VISIT NOTE  Subjective:    Patient ID: LEOLA FIORE, female    DOB: 1995-09-01, 27 y.o.   MRN: [id number]  HPI  Patient is a 27 y.o. G59P1001 female who presents for depo provera injection.   Objective:    BP 130/77   Pulse 73   Ht 5\' 7"  (1.702 m)   Wt 182 lb 4.8 oz (82.7 kg)   BMI 28.55 kg/m   Last Annual: 10/08/22. Last pap: 10/08/22. Last Depo-Provera: 11/19/22. Side Effects if any: none. Serum HCG indicated? No . Depo-Provera 150 mg IM given by: Rocco Serene, LPN. Site: Right Deltoid    Assessment:   1. Encounter for surveillance of injectable contraceptive      Plan:   Next appointment due between 04/29/23 and 05/13/23.    Rocco Serene, LPN

## 2023-02-11 NOTE — Patient Instructions (Signed)

## 2023-02-12 ENCOUNTER — Ambulatory Visit: Payer: Medicaid Other | Admitting: Family Medicine

## 2023-02-12 ENCOUNTER — Encounter: Payer: Self-pay | Admitting: Family Medicine

## 2023-02-12 VITALS — BP 118/78 | HR 80 | Ht 67.0 in | Wt 185.0 lb

## 2023-02-12 DIAGNOSIS — R3989 Other symptoms and signs involving the genitourinary system: Secondary | ICD-10-CM | POA: Diagnosis not present

## 2023-02-12 DIAGNOSIS — Z021 Encounter for pre-employment examination: Secondary | ICD-10-CM | POA: Insufficient documentation

## 2023-02-12 MED ORDER — AMITRIPTYLINE HCL 25 MG PO TABS: 25.0000 mg | ORAL_TABLET | Freq: Every day | ORAL | 0 refills | Status: DC

## 2023-02-12 NOTE — Assessment & Plan Note (Signed)
Recurrent symptoms with negative infectious workup at urgent care recently. Concern for MSK raised there.  Differential can be MSK, interstitial cystitis, infectious  - Patient to dose NSAIDs and muscle relaxer - If ineffective start amitriptyline - Recalcitrant symptoms to be addressed by urogyn

## 2023-02-12 NOTE — Patient Instructions (Signed)
-   Obtain labs - Start home exercises on regular basis - Use medication from urgent care - If still symptomatic, start nightly amitriptyline - Contact for any persistent symptoms

## 2023-02-12 NOTE — Progress Notes (Signed)
     Primary Care / Sports Medicine Office Visit  Patient Information:  Patient ID: Katrina Ortega, female DOB: [birth year] Age: 27 y.o. MRN: [id number]   Katrina Ortega is a pleasant 28 y.o. female presenting with the following:  Chief Complaint  Patient presents with   school forms    Vitals:   02/12/23 1038  BP: 118/78  Pulse: 80  SpO2: 98%   Vitals:   02/12/23 1038  Weight: 185 lb (83.9 kg)  Height: 5\' 7"  (1.702 m)   Body mass index is 28.98 kg/m.  No results found.   Independent interpretation of notes and tests performed by another provider:   None  Procedures performed:   None  Pertinent History, Exam, Impression, and Recommendations:   Katrina Ortega was seen today for school forms.  Encounter for pre-employment health screening examination Assessment & Plan: Starting nursing school, ordered appropriate requested tests.  Orders: -     Urinalysis -     QuantiFERON-TB Gold Plus -     Microalbumin / creatinine urine ratio  Bladder pain Assessment & Plan: Recurrent symptoms with negative infectious workup at urgent care recently. Concern for MSK raised there.  Differential can be MSK, interstitial cystitis, infectious  - Patient to dose NSAIDs and muscle relaxer - If ineffective start amitriptyline - Recalcitrant symptoms to be addressed by urogyn  Orders: -     Amitriptyline HCl; Take 1 tablet (25 mg total) by mouth at bedtime.  Dispense: 90 tablet; Refill: 0     Orders & Medications Meds ordered this encounter  Medications   amitriptyline (ELAVIL) 25 MG tablet    Sig: Take 1 tablet (25 mg total) by mouth at bedtime.    Dispense:  90 tablet    Refill:  0   Orders Placed This Encounter  Procedures   Urinalysis   QuantiFERON-TB Gold Plus   Urine Microalbumin w/creat. ratio     No follow-ups on file.     Jerrol Banana, MD, [facility]   Primary Care Sports Medicine Primary Care and Sports Medicine at [facility]

## 2023-02-12 NOTE — Assessment & Plan Note (Signed)
Starting nursing school, ordered appropriate requested tests.

## 2023-02-15 LAB — URINALYSIS
Bilirubin, UA: NEGATIVE
Glucose, UA: NEGATIVE
Ketones, UA: NEGATIVE
Leukocytes,UA: NEGATIVE
Nitrite, UA: NEGATIVE
Protein,UA: NEGATIVE
RBC, UA: NEGATIVE
Specific Gravity, UA: 1.022 (ref 1.005–1.030)
Urobilinogen, Ur: 0.2 mg/dL (ref 0.2–1.0)
pH, UA: 6.5 (ref 5.0–7.5)

## 2023-02-15 LAB — QUANTIFERON-TB GOLD PLUS
QuantiFERON Mitogen Value: >10 IU/mL
QuantiFERON Nil Value: 0.42 IU/mL
QuantiFERON TB1 Ag Value: 0.36 IU/mL
QuantiFERON TB2 Ag Value: 0.29 IU/mL
QuantiFERON-TB Gold Plus: NEGATIVE

## 2023-02-15 LAB — MICROALBUMIN / CREATININE URINE RATIO
Creatinine, Urine: 85.5 mg/dL
Microalb/Creat Ratio: <4 mg/g creat (ref 0–29)
Microalbumin, Urine: <3 ug/mL

## 2023-02-25 ENCOUNTER — Telehealth: Payer: Self-pay

## 2023-02-25 NOTE — Telephone Encounter (Signed)
We received patient letter for confirmation of appointment date and time return back in the mail.

## 2023-03-07 ENCOUNTER — Other Ambulatory Visit: Payer: Self-pay

## 2023-03-10 NOTE — Progress Notes (Unsigned)
Celso Amy, PA-C [address]  [address]  [place], [place] [address]  Main: [phone]  Fax: [phone]   Gastroenterology Consultation  Referring Provider:     Jerrol Banana, MD Primary Care Physician:  Jerrol Banana, MD Primary Gastroenterologist:  Celso Amy, PA-C  Reason for Consultation:     GERD        HPI:   Katrina Ortega is a 27 y.o. y/o female referred for consultation & management  by Jerrol Banana, MD.    Patient has history of acid reflux for over 5 years.  Worse after certain foods such as sweet tea, sodas, spicy sauces, or acidic foods.  Takes pantoprazole 20 mg sporadically which helps.  Not consistent with treatment.  Has vague mild solid food dysphagia symptoms.  Occasionally [place] fries or hamburger gets stuck in her throat and feels like it takes a while to go down.  She denies nausea, vomiting, or food bolus episodes.  Admits to alcohol, tobacco, and marijuana use.  She has chronic constipation for over a year.  No treatment.  She has not tried MiraLAX.  Denies rectal bleeding or weight loss.  Admits to weight gain, abdominal bloating and swelling.  No abdominal pain.  Past Medical History:  Diagnosis Date   Allergy    Anemia    Anxiety and depression    GERD (gastroesophageal reflux disease)    Group beta Strep positive 08/24/2018   Identified in urine 01/22 Will require prophylaxis in labor   Hypertension    preeclampsia   Medical history non-contributory    Mental disorder    Supervision of other normal pregnancy, antepartum 08/19/2018    Nursing Staff Provider Office Location  [facility] [place] Dating   LMP Language   ENG Anatomy US  Normal Flu Vaccine  Declined Genetic Screen  Declined  TDaP vaccine   11/04/2018 Hgb A1C or  GTT Early  Third trimester  Rhogam   N/A: A POS   LAB RESULTS  Feeding Plan  breast Blood Type A/Positive/-- (01/22 1555)  Contraception  Depo Antibody Negative (01/22 1555) Circumcision Yes Rubella 3.68 (01/22 1555)  Pediatri    Past Surgical History:  Procedure Laterality Date   CESAREAN SECTION N/A 01/22/2019   Procedure: CESAREAN SECTION;  Surgeon: Catalina Antigua, MD;  Location: [facility]S;  Service: Obstetrics;  Laterality: N/A;   TOENAIL EXCISION Bilateral 2021    Prior to Admission medications   Medication Sig Start Date End Date Taking? Authorizing Provider  Vitamin D, Ergocalciferol, (DRISDOL) 1.25 MG (50000 UNIT) CAPS capsule Take 1 capsule (50,000 Units total) by mouth every 7 (seven) days. Take for 8 total doses(weeks) 12/11/22   Jerrol Banana, MD  amitriptyline (ELAVIL) 25 MG tablet Take 1 tablet (25 mg total) by mouth at bedtime. 02/12/23   Jerrol Banana, MD  cetirizine (ZYRTEC ALLERGY) 10 MG tablet Take 1 tablet (10 mg total) by mouth daily. 05/26/19   Calvert Cantor, CNM  fluconazole (DIFLUCAN) 150 MG tablet Take 1 tablet by mouth on Day 1 and then may repeat on Day 3. 01/11/23   [provider]  loratadine (CLARITIN REDITABS) 10 MG dissolvable tablet Take 10 mg by mouth daily.    [provider]  naproxen (NAPROSYN) 500 MG tablet Take by mouth. 02/04/23 02/04/24  [provider]  nitrofurantoin, macrocrystal-monohydrate, (MACROBID) 100 MG capsule SMARTSIG:1 Capsule(s) By Mouth Morning-Evening 01/11/23   [provider]  pantoprazole (PROTONIX) 20 MG tablet TAKE 1  TABLET(20 MG) BY MOUTH DAILY AS NEEDED FOR HEARTBURN OR INDIGESTION 10/07/22   Jerrol Banana, MD  tiZANidine (ZANAFLEX) 4 MG tablet Take by mouth. 02/04/23   [provider]    Family History  Problem Relation Age of Onset   Multiple sclerosis Mother    Migraines Mother    Hypertension Father    Migraines Father    Other Father        tumor "in front of brain"   Diabetes Paternal Uncle    Schizophrenia Maternal Grandmother    Diabetes Maternal Grandfather    Diabetes Paternal Grandmother    Hypertension Paternal Grandmother    Eczema Son    Breast cancer Maternal  Great-grandmother        mid to late years     Social History   Tobacco Use   Smoking status: Every Day    Types: Cigars   Smokeless tobacco: Never  Vaping Use   Vaping status: Former   Substances: Nicotine, THC, Flavoring  Substance Use Topics   Alcohol use: Yes    Alcohol/week: 6.0 standard drinks of alcohol    Types: 6 Shots of liquor per week    Comment: weekends socially   Drug use: Yes    Frequency: 2.0 times per week    Types: Marijuana    Allergies as of 03/11/2023 - Review Complete 03/11/2023  Allergen Reaction Noted   Percocet [oxycodone-acetaminophen] Itching 09/28/2016    Review of Systems:    All systems reviewed and negative except where noted in HPI.   Physical Exam:  BP 110/67   Pulse 94   Temp 98.4 F (36.9 C)   Ht 5\' 7"  (1.702 m)   Wt 185 lb 3.2 oz (84 kg)   BMI 29.01 kg/m  No LMP recorded. Patient has had an injection.  General:   Alert,  Well-developed, well-nourished, pleasant and cooperative in NAD Lungs:  Respirations even and unlabored.  Clear throughout to auscultation.   No wheezes, crackles, or rhonchi. No acute distress. Heart:  Regular rate and rhythm; no murmurs, clicks, rubs, or gallops. Abdomen:  Normal bowel sounds.  No bruits.  Soft, and obese without masses, hepatosplenomegaly or hernias noted.  Very mild generalized lower abdominal tenderness.  No upper abdominal tenderness.   No guarding or rebound tenderness.    Neurologic:  Alert and oriented x3;  grossly normal neurologically. Psych:  Alert and cooperative. Normal mood and affect.  Imaging Studies: No results found.  Assessment and Plan:   Katrina Ortega is a 27 y.o. y/o female has been referred for:  1.  GERD  Continue pantoprazole 20 Mg once daily.   Try OTC Pepcid 20 Mg 1 tablet twice daily or Tums antacid as needed.  Recommend Lifestyle Modifications to prevent Acid Reflux.  Rec. Avoid coffee, sodas, peppermint, citrus fruits, greasy food, spicey foods alcohol and  tobacco.  Avoid eating 2-3 hours before bedtime.  Encouraged weight loss to help with acid reflux.  We discussed adverse side effects of PPIs to include vitamin deficiencies, osteoporosis, renal insufficiency, dementia and increased risk of C. Difficile.  Recommend take lowest effective dose of PPI necessary to control acid reflux.  OK to add H2RB (Pepcid 20mg  daily) or antiacid if needed for breakthrough acid reflux.   2.  Dysphagia -mild vague symptoms with solid food sticks in throat.  Ordering barium swallow with tablet.  3.  Chronic constipation  Start MiraLAX, mix 1 capful in a drink once daily.  Discussed  constipation treatment at length. Recommend High Fiber diet with fruits, vegetables, and whole grains. Drink 64 ounces of Fluids Daily. If no improvement, consider Linzess, Amitiza or Trulance.   Follow up in 3 months.  Celso Amy, PA-C

## 2023-03-11 ENCOUNTER — Ambulatory Visit: Payer: Medicaid Other | Admitting: Physician Assistant

## 2023-03-11 ENCOUNTER — Encounter: Payer: Self-pay | Admitting: Physician Assistant

## 2023-03-11 VITALS — BP 110/67 | HR 94 | Temp 98.4°F | Ht 67.0 in | Wt 185.2 lb

## 2023-03-11 DIAGNOSIS — R1310 Dysphagia, unspecified: Secondary | ICD-10-CM

## 2023-03-11 DIAGNOSIS — K219 Gastro-esophageal reflux disease without esophagitis: Secondary | ICD-10-CM

## 2023-03-11 DIAGNOSIS — K5904 Chronic idiopathic constipation: Secondary | ICD-10-CM

## 2023-03-11 MED ORDER — PANTOPRAZOLE SODIUM 20 MG PO TBEC
20.0000 mg | DELAYED_RELEASE_TABLET | Freq: Every day | ORAL | 5 refills | Status: DC
Start: 2023-03-11 — End: 2023-05-30

## 2023-03-11 MED ORDER — POLYETHYLENE GLYCOL 3350 17 GM/SCOOP PO POWD
ORAL | Status: DC
Start: 2023-03-11 — End: 2023-11-07

## 2023-03-11 NOTE — Patient Instructions (Signed)
Barium Swallow scheduled 03/21/23 @ 10:15 [facility] to check in. Nothing to eat/drink 3 hours prior.

## 2023-03-21 ENCOUNTER — Ambulatory Visit
Admission: RE | Admit: 2023-03-21 | Discharge: 2023-03-21 | Disposition: A | Discharge status: Home / Self Care | Payer: Medicaid Other | Source: Ambulatory Visit | Attending: Physician Assistant | Admitting: Physician Assistant

## 2023-03-21 ENCOUNTER — Other Ambulatory Visit: Payer: Self-pay | Admitting: Physician Assistant

## 2023-03-21 DIAGNOSIS — K5904 Chronic idiopathic constipation: Secondary | ICD-10-CM

## 2023-03-21 DIAGNOSIS — K219 Gastro-esophageal reflux disease without esophagitis: Secondary | ICD-10-CM

## 2023-03-21 DIAGNOSIS — R1310 Dysphagia, unspecified: Secondary | ICD-10-CM

## 2023-04-21 DIAGNOSIS — R350 Frequency of micturition: Secondary | ICD-10-CM | POA: Diagnosis not present

## 2023-04-21 DIAGNOSIS — N898 Other specified noninflammatory disorders of vagina: Secondary | ICD-10-CM | POA: Diagnosis not present

## 2023-04-21 DIAGNOSIS — Z113 Encounter for screening for infections with a predominantly sexual mode of transmission: Secondary | ICD-10-CM | POA: Diagnosis not present

## 2023-05-09 ENCOUNTER — Ambulatory Visit: Payer: Medicaid Other

## 2023-05-09 VITALS — BP 118/69 | HR 95 | Resp 16 | Ht 67.0 in | Wt 185.2 lb

## 2023-05-09 DIAGNOSIS — Z3042 Encounter for surveillance of injectable contraceptive: Secondary | ICD-10-CM | POA: Diagnosis not present

## 2023-05-09 DIAGNOSIS — Z23 Encounter for immunization: Secondary | ICD-10-CM

## 2023-05-09 MED ORDER — MEDROXYPROGESTERONE ACETATE 150 MG/ML IM SUSP
150.0000 mg | Freq: Once | INTRAMUSCULAR | Status: AC
Start: 2023-05-09 — End: 2023-05-09
  Administered 2023-05-09: 150 mg via INTRAMUSCULAR

## 2023-05-09 NOTE — Progress Notes (Signed)
    NURSE VISIT NOTE  Subjective:    Patient ID: Katrina Ortega, female    DOB: 07-14-1996, 27 y.o.   MRN: [id number]  HPI  Patient is a 27 y.o. [id number] female who presents for depo provera injection.   Objective:    BP 118/69   Pulse 95   Resp 16   Ht 5\' 7"  (1.702 m)   Wt 185 lb 3.2 oz (84 kg)   BMI 29.01 kg/m   Last Annual: 10/08/2022. Last pap: 10/08/2022. Last Depo-Provera: 02/11/2023. Side Effects if any: weigh gain and headaches. Serum HCG indicated? No . Depo-Provera 150 mg IM given by: Santiago Bumpers, CMA. Site: Left Deltoid  Lab Review  @THIS  VISIT ONLY@  Assessment:   1. Encounter for surveillance of injectable contraceptive      Plan:   Next appointment due between December 27  and January 10.    Santiago Bumpers, CMA [facility]

## 2023-05-09 NOTE — Patient Instructions (Addendum)
Influenza (Flu) Vaccine (Inactivated or Recombinant): What You Need to Know Many vaccine information statements are available in Spanish and other languages. See PromoAge.com.br. 1. Why get vaccinated? Influenza vaccine can prevent influenza (flu). Flu is a contagious disease that spreads around the [place] every year, usually between October and May. Anyone can get the flu, but it is more dangerous for some people. Infants and young children, people 75 years and older, pregnant people, and people with certain health conditions or a weakened immune system are at greatest risk of flu complications. Pneumonia, bronchitis, sinus infections, and ear infections are examples of flu-related complications. If you have a medical condition, such as heart disease, cancer, or diabetes, flu can make it worse. Flu can cause fever and chills, sore throat, muscle aches, fatigue, cough, headache, and runny or stuffy nose. Some people may have vomiting and diarrhea, though this is more common in children than adults. In an average year, thousands of people in the [place] die from flu, and many more are hospitalized. Flu vaccine prevents millions of illnesses and flu-related visits to the doctor each year. 2. Influenza vaccines CDC recommends everyone 6 months and older get vaccinated every flu season. Children 6 months through 80 years of age may need 2 doses during a single flu season. Everyone else needs only 1 dose each flu season. It takes about 2 weeks for protection to develop after vaccination. There are many flu viruses, and they are always changing. Each year a new flu vaccine is made to protect against the influenza viruses believed to be likely to cause disease in the upcoming flu season. Even when the vaccine doesn't exactly match these viruses, it may still provide some protection. Influenza vaccine does not cause flu. Influenza vaccine may be given at the same time as other vaccines. 3.  Talk with your health care provider Tell your vaccination provider if the person getting the vaccine: Has had an allergic reaction after a previous dose of influenza vaccine, or has any severe, life-threatening allergies Has ever had Guillain-Barr Syndrome (also called "GBS") In some cases, your health care provider may decide to postpone influenza vaccination until a future visit. Influenza vaccine can be administered at any time during pregnancy. People who are or will be pregnant during influenza season should receive inactivated influenza vaccine. People with minor illnesses, such as a cold, may be vaccinated. People who are moderately or severely ill should usually wait until they recover before getting influenza vaccine. Your health care provider can give you more information. 4. Risks of a vaccine reaction Soreness, redness, and swelling where the shot is given, fever, muscle aches, and headache can happen after influenza vaccination. There may be a very small increased risk of Guillain-Barr Syndrome (GBS) after inactivated influenza vaccine (the flu shot). Young children who get the flu shot along with pneumococcal vaccine (PCV13) and/or DTaP vaccine at the same time might be slightly more likely to have a seizure caused by fever. Tell your health care provider if a child who is getting flu vaccine has ever had a seizure. People sometimes faint after medical procedures, including vaccination. Tell your provider if you feel dizzy or have vision changes or ringing in the ears. As with any medicine, there is a very remote chance of a vaccine causing a severe allergic reaction, other serious injury, or death. 5. What if there is a serious problem? An allergic reaction could occur after the vaccinated person leaves the clinic. If you see signs of  a severe allergic reaction (hives, swelling of the face and throat, difficulty breathing, a fast heartbeat, dizziness, or weakness), call 9-1-1 and get  the person to the nearest hospital. For other signs that concern you, call your health care provider. Adverse reactions should be reported to the Vaccine Adverse Event Reporting System (VAERS). Your health care provider will usually file this report, or you can do it yourself. Visit the VAERS website at www.vaers.LAgents.no or call 626-255-1982. VAERS is only for reporting reactions, and VAERS staff members do not give medical advice. 6. The National Vaccine Injury Compensation Program The [employer] Vaccine Injury Compensation Program (VICP) is a federal program that was created to compensate people who may have been injured by certain vaccines. Claims regarding alleged injury or death due to vaccination have a time limit for filing, which may be as short as two years. Visit the VICP website at SpiritualWord.at or call (703)478-0026 to learn about the program and about filing a claim. 7. How can I learn more? Ask your health care provider. Call your local or state health department. Visit the website of the Food and Drug Administration (FDA) for vaccine package inserts and additional information at FinderList.no. Contact the Centers for Disease Control and Prevention (CDC): Call (684)429-8320 (1-800-CDC-INFO) or Visit CDC's website at BiotechRoom.com.cy. Source: CDC Vaccine Information Statement Inactivated Influenza Vaccine (03/03/2020) This same material is available at FootballExhibition.com.br for no charge. This information is not intended to replace advice given to you by your health care provider. Make sure you discuss any questions you have with your health care provider. Document Revised: 10/30/2022 Document Reviewed: 08/05/2022 Elsevier Patient Education  2024 Elsevier Inc. Medroxyprogesterone Injection (Contraception) What is this medication? MEDROXYPROGESTERONE (me DROX ee proe JES te rone) prevents ovulation and pregnancy. It belongs to a group of  medications called contraceptives. This medication is a progestin hormone. This medicine may be used for other purposes; ask your health care provider or pharmacist if you have questions. COMMON BRAND NAME(S): Depo-Provera, Depo-subQ Provera 104 What should I tell my care team before I take this medication? They need to know if you have any of these conditions: Asthma Blood clots Breast cancer or family history of breast cancer Depression Diabetes Eating disorder (anorexia nervosa) Frequently drink alcohol Heart attack High blood pressure HIV infection or AIDS Kidney disease Liver disease Migraine headaches Osteoporosis, weak bones Seizures Stroke Tobacco use Vaginal bleeding An unusual or allergic reaction to medroxyprogesterone, other medications, foods, dyes, or preservatives Pregnant or trying to get pregnant Breast-feeding How should I use this medication? Depo-Provera CI contraceptive injection is given into a muscle. Depo-subQ Provera 104 injection is given under the skin. It is given in a hospital or clinic setting. The injection is usually given during the first 5 days after the start of a menstrual period or 6 weeks after delivery of a baby. A patient package insert for the product will be given with each prescription and refill. Be sure to read this information carefully each time. The sheet may change often. Talk to your care team about the use of this medication in children. Special care may be needed. These injections have been used in female children who have started having menstrual periods. Overdosage: If you think you have taken too much of this medicine contact a poison control center or emergency room at once. NOTE: This medicine is only for you. Do not share this medicine with others. What if I miss a dose? Keep appointments for follow-up doses. You must get  an injection once every 3 months. It is important not to miss your dose. Call your care team if you are  unable to keep an appointment. What may interact with this medication? Antibiotics or medications for infections, especially rifampin and griseofulvin Antivirals for HIV or hepatitis Aprepitant Armodafinil Bexarotene Bosentan Medications for seizures, such as carbamazepine, felbamate, oxcarbazepine, phenytoin, phenobarbital, primidone, topiramate Mitotane Modafinil St. John's Wort This list may not describe all possible interactions. Give your health care provider a list of all the medicines, herbs, non-prescription drugs, or dietary supplements you use. Also tell them if you smoke, drink alcohol, or use illegal drugs. Some items may interact with your medicine. What should I watch for while using this medication? This medication does not protect you against HIV infection (AIDS) or other sexually transmitted diseases. Use of this product may cause you to lose calcium from your bones. Loss of calcium may cause weak bones (osteoporosis). Only use this product for more than 2 years if other forms of birth control are not right for you. The longer you use this product for birth control the more likely you will be at risk for weak bones. Ask your care team how you can keep strong bones. You may have a change in bleeding pattern or irregular periods. Many females stop having periods while taking this medication. If you have received your injections on time, your chance of being pregnant is very low. If you think you may be pregnant, see your care team as soon as possible. Tell your care team if you want to get pregnant within the next year. The effect of this medication may last a long time after you get your last injection. What side effects may I notice from receiving this medication? Side effects that you should report to your care team as soon as possible: Allergic reactions--skin rash, itching, hives, swelling of the face, lips, tongue, or throat Blood clot--pain, swelling, or warmth in the leg,  shortness of breath, chest pain Gallbladder problems--severe stomach pain, nausea, vomiting, fever Increase in blood pressure Liver injury--right upper belly pain, loss of appetite, nausea, light-colored stool, dark yellow or brown urine, yellowing skin or eyes, unusual weakness or fatigue New or worsening migraines or headaches Seizures Stroke--sudden numbness or weakness of the face, arm, or leg, trouble speaking, confusion, trouble walking, loss of balance or coordination, dizziness, severe headache, change in vision Unusual vaginal discharge, itching, or odor Worsening mood, feelings of depression Side effects that usually do not require medical attention (report to your care team if they continue or are bothersome): Breast pain or tenderness Dark patches of the skin on the face or other sun-exposed areas Irregular menstrual cycles or spotting Nausea Weight gain This list may not describe all possible side effects. Call your doctor for medical advice about side effects. You may report side effects to FDA at 1-800-FDA-1088. Where should I keep my medication? This injection is only given by a care team. It will not be stored at home. NOTE: This sheet is a summary. It may not cover all possible information. If you have questions about this medicine, talk to your doctor, pharmacist, or health care provider.  2024 Elsevier/Gold Standard (2021-03-28 00:00:00)

## 2023-05-13 ENCOUNTER — Encounter: Payer: Self-pay | Admitting: Family Medicine

## 2023-05-21 ENCOUNTER — Ambulatory Visit: Payer: Self-pay

## 2023-05-21 NOTE — Telephone Encounter (Signed)
  Chief Complaint: swollen lymph node  Symptoms: sore and red and moves and hard Frequency: Mon night Pertinent Negatives: Patient denies fever Disposition: [] ED /[] Urgent Care (no appt availability in office) / [x] Appointment(In office/virtual)/ []  [facility]/ [] Home Care/ [] Refused Recommended Disposition /[] [facility]/ []  Follow-up with PCP Additional Notes: appt made Reason for Disposition . [1] Single large node AND [2] size > 1 inch (2.5 cm) AND [3] no fever  Answer Assessment - Initial Assessment Questions 1. LOCATION: "Where is the swollen node located?" "Is the matching node on the other side of the body also swollen?"      Near left clavicle and muscle and arm neck hurt- red and warm  2. SIZE: "How big is the node?" (e.g., inches or centimeters; or compared to common objects such as pea, bean, marble, golf ball)      gumball 3. ONSET: "When did the swelling start?"      Monday night 4. NECK NODES: "Is there a sore throat, runny nose or other symptoms of a cold?"      Recently sinus , virus green mucus from nose and cough  5. GROIN OR ARMPIT NODES: "Is there a sore, scratch, cut or painful red area on that arm or leg?"      no 6. FEVER: "Do you have a fever?" If Yes, ask: "What is it, how was it measured, and when did it start?"      no 7. CAUSE: "What do you think is causing the swollen lymph nodes?"     Been sick with sinus infection sore throat (green phlegm) 8. OTHER SYMPTOMS: "Do you have any other symptoms?"     Ha fatigue sore to touch  9. PREGNANCY: "Is there any chance you are pregnant?" "When was your last menstrual period?"     N/a  Protocols used: Lymph Nodes - Swollen-A-AH

## 2023-05-22 ENCOUNTER — Ambulatory Visit: Payer: Medicaid Other | Admitting: Family Medicine

## 2023-05-23 ENCOUNTER — Ambulatory Visit (INDEPENDENT_AMBULATORY_CARE_PROVIDER_SITE_OTHER): Payer: Medicaid Other | Admitting: Family Medicine

## 2023-05-23 ENCOUNTER — Encounter: Payer: Self-pay | Admitting: Family Medicine

## 2023-05-23 VITALS — BP 112/60 | HR 68 | Ht 67.0 in | Wt 180.6 lb

## 2023-05-23 DIAGNOSIS — R3989 Other symptoms and signs involving the genitourinary system: Secondary | ICD-10-CM | POA: Diagnosis not present

## 2023-05-23 DIAGNOSIS — J018 Other acute sinusitis: Secondary | ICD-10-CM | POA: Diagnosis not present

## 2023-05-23 DIAGNOSIS — J019 Acute sinusitis, unspecified: Secondary | ICD-10-CM | POA: Insufficient documentation

## 2023-05-23 MED ORDER — FLUCONAZOLE 150 MG PO TABS
150.0000 mg | ORAL_TABLET | Freq: Once | ORAL | 0 refills | Status: AC
Start: 2023-05-23 — End: 2023-05-23

## 2023-05-23 MED ORDER — AZITHROMYCIN 250 MG PO TABS: ORAL_TABLET | ORAL | 0 refills | Status: AC

## 2023-05-23 MED ORDER — AMITRIPTYLINE HCL 25 MG PO TABS
25.0000 mg | ORAL_TABLET | Freq: Every day | ORAL | 0 refills | Status: DC
Start: 2023-05-23 — End: 2023-08-25

## 2023-05-23 NOTE — Assessment & Plan Note (Signed)
Chronic, ongoing symptomatology, patient did not get a chance to start amitriptyline but is amenable to do so.

## 2023-05-23 NOTE — Patient Instructions (Signed)
-   Start amitriptyline nightly - Take azithromycin for full course - Can take Diflucan if yeast symptoms develop - If left lymph node persists without change, contact our office for next steps

## 2023-05-23 NOTE — Assessment & Plan Note (Signed)
Patient with 7-day history of sinus congestion, drainage, head and sinus pressure, lymphadenopathy localized to the left supraclavicular region denies any other constitutional symptoms.  The latter of which only occurred with the 7-day history of upper respiratory symptoms.  Examination with tenderness at the sinuses, swollen, erythematous turbinates, oropharynx benign, tenderness to the left supraclavicular nodule, cardiopulmonary findings benign.  Clinical picture most consistent with acute sinusitis.  Plan: - Take azithromycin for full course - Can take Diflucan if yeast symptoms develop - If left lymph node persists without change, contact our office for next steps - If noted, ultrasound, labs to be coordinated

## 2023-05-23 NOTE — Progress Notes (Signed)
     Primary Care / Sports Medicine Office Visit  Patient Information:  Patient ID: Katrina Ortega, female DOB: 1995-09-30 Age: 27 y.o. MRN: [id number]   Katrina Ortega is a pleasant 27 y.o. female presenting with the following:  Chief Complaint  Patient presents with   Cough    X 1 week. Sinus drainage. Headache, and sinus pressure. Left sided neck knot.     Vitals:   05/23/23 1438  BP: 112/60  Pulse: 68  SpO2: 97%   Vitals:   05/23/23 1438  Weight: 180 lb 9.6 oz (81.9 kg)  Height: 5\' 7"  (1.702 m)   Body mass index is 28.29 kg/m.  No results found.   Independent interpretation of notes and tests performed by another provider:   None  Procedures performed:   None  Pertinent History, Exam, Impression, and Recommendations:   Problem List Items Addressed This Visit       Respiratory   Acute infection of nasal sinus - Primary    Patient with 7-day history of sinus congestion, drainage, head and sinus pressure, lymphadenopathy localized to the left supraclavicular region denies any other constitutional symptoms.  The latter of which only occurred with the 7-day history of upper respiratory symptoms.  Examination with tenderness at the sinuses, swollen, erythematous turbinates, oropharynx benign, tenderness to the left supraclavicular nodule, cardiopulmonary findings benign.  Clinical picture most consistent with acute sinusitis.  Plan: - Take azithromycin for full course - Can take Diflucan if yeast symptoms develop - If left lymph node persists without change, contact our office for next steps - If noted, ultrasound, labs to be coordinated      Relevant Medications   azithromycin (ZITHROMAX) 250 MG tablet   fluconazole (DIFLUCAN) 150 MG tablet     Other   Bladder pain    Chronic, ongoing symptomatology, patient did not get a chance to start amitriptyline but is amenable to do so.      Relevant Medications   amitriptyline (ELAVIL) 25 MG tablet      Orders & Medications Medications:  Meds ordered this encounter  Medications   azithromycin (ZITHROMAX) 250 MG tablet    Sig: Take 2 tablets on day 1, then 1 tablet daily on days 2 through 5    Dispense:  6 tablet    Refill:  0   fluconazole (DIFLUCAN) 150 MG tablet    Sig: Take 1 tablet (150 mg total) by mouth once for 1 dose.    Dispense:  1 tablet    Refill:  0   amitriptyline (ELAVIL) 25 MG tablet    Sig: Take 1 tablet (25 mg total) by mouth at bedtime.    Dispense:  90 tablet    Refill:  0   No orders of the defined types were placed in this encounter.    No follow-ups on file.     Jerrol Banana, MD, [facility]   Primary Care Sports Medicine Primary Care and Sports Medicine at [facility]

## 2023-05-29 ENCOUNTER — Encounter: Payer: Self-pay | Admitting: Physician Assistant

## 2023-05-30 ENCOUNTER — Ambulatory Visit (INDEPENDENT_AMBULATORY_CARE_PROVIDER_SITE_OTHER): Payer: Medicaid Other | Admitting: Physician Assistant

## 2023-05-30 ENCOUNTER — Encounter: Payer: Self-pay | Admitting: Physician Assistant

## 2023-05-30 VITALS — BP 110/72 | HR 89 | Temp 98.5°F | Ht 67.0 in | Wt 181.4 lb

## 2023-05-30 DIAGNOSIS — R198 Other specified symptoms and signs involving the digestive system and abdomen: Secondary | ICD-10-CM

## 2023-05-30 DIAGNOSIS — K649 Unspecified hemorrhoids: Secondary | ICD-10-CM | POA: Diagnosis not present

## 2023-05-30 DIAGNOSIS — R1084 Generalized abdominal pain: Secondary | ICD-10-CM | POA: Diagnosis not present

## 2023-05-30 DIAGNOSIS — K625 Hemorrhage of anus and rectum: Secondary | ICD-10-CM | POA: Diagnosis not present

## 2023-05-30 DIAGNOSIS — R1013 Epigastric pain: Secondary | ICD-10-CM

## 2023-05-30 DIAGNOSIS — K582 Mixed irritable bowel syndrome: Secondary | ICD-10-CM | POA: Diagnosis not present

## 2023-05-30 DIAGNOSIS — K219 Gastro-esophageal reflux disease without esophagitis: Secondary | ICD-10-CM

## 2023-05-30 MED ORDER — PEG 3350-KCL-NA BICARB-NACL 420 G PO SOLR: 4000.0000 mL | Freq: Once | ORAL | 0 refills | Status: AC

## 2023-05-30 MED ORDER — OMEPRAZOLE 40 MG PO CPDR: 40.0000 mg | DELAYED_RELEASE_CAPSULE | Freq: Two times a day (BID) | ORAL | 2 refills | Status: AC

## 2023-05-30 MED ORDER — HYDROCORTISONE (PERIANAL) 2.5 % EX CREA: 1.0000 | TOPICAL_CREAM | Freq: Two times a day (BID) | CUTANEOUS | 1 refills | Status: AC

## 2023-05-30 NOTE — Progress Notes (Unsigned)
Celso Amy, PA-C [address]  [address]  [place], [place] [address]  Main: [phone]  Fax: [phone]   Primary Care Physician: Jerrol Banana, MD  Primary Gastroenterologist:  Celso Amy, PA-C / Dr. Wyline Mood    CC: Rectal bleeding, abdominal pain, heartburn  HPI: Katrina Ortega is a 27 y.o. female presents for flareup of GI symptoms.  She states she has had blood in her stool for several days.  She has decreased appetite, upper abdominal pain, and occasional diarrhea.  Having bad heartburn and acid reflux with abdominal cramping after eating.  She has episodes of diarrhea alternating with constipation for 1 year.  History of hemorrhoids since giving childbirth.  She is seeing a lot of bright red blood in her stools with increased lower abdominal cramping and diarrhea after eating in the past few weeks.  Currently taking pantoprazole 20 Mg daily sporadically which is not controlling her reflux.  She states omeprazole works better than pantoprazole in the past and she would like to change medication.  She takes OTC Pepcid or Tums as needed.  3 months ago was started on MiraLAX 1 capful daily, not currently taking..  Barium swallow with tablet (to evaluate dysphagia) 03/21/2023 showed small volume acid reflux, otherwise normal.  No hiatal hernia or esophageal stricture.  Current Outpatient Medications  Medication Sig Dispense Refill   amitriptyline (ELAVIL) 25 MG tablet Take 1 tablet (25 mg total) by mouth at bedtime. 90 tablet 0   azithromycin (ZITHROMAX) 250 MG tablet Take by mouth daily.     cetirizine (ZYRTEC ALLERGY) 10 MG tablet Take 1 tablet (10 mg total) by mouth daily. 30 tablet 2   hydrocortisone (ANUSOL-HC) 2.5 % rectal cream Place 1 Application rectally 2 (two) times daily. 30 g 1   naproxen (NAPROSYN) 500 MG tablet Take by mouth.     omeprazole (PRILOSEC) 40 MG capsule Take 1 capsule (40 mg total) by mouth 2 (two) times daily. 60 capsule 2   polyethylene  glycol powder (GLYCOLAX/MIRALAX) 17 GM/SCOOP powder Mix 1 capful in a Drink Once daily for Constipation.     tiZANidine (ZANAFLEX) 4 MG tablet Take by mouth.     Vitamin D, Ergocalciferol, (DRISDOL) 1.25 MG (50000 UNIT) CAPS capsule Take 1 capsule (50,000 Units total) by mouth every 7 (seven) days. Take for 8 total doses(weeks) 8 capsule 0   Current Facility-Administered Medications  Medication Dose Route Frequency Provider Last Rate Last Admin   medroxyPROGESTERone (DEPO-PROVERA) injection 150 mg  150 mg Intramuscular Q90 days Copland, Alicia B, PA-C   150 mg at 02/11/23 1030    Allergies as of 05/30/2023 - Review Complete 05/30/2023  Allergen Reaction Noted   Percocet [oxycodone-acetaminophen] Itching 09/28/2016    Past Medical History:  Diagnosis Date   Allergy    Anemia    Anxiety and depression    GERD (gastroesophageal reflux disease)    Group beta Strep positive 08/24/2018   Identified in urine 01/22 Will require prophylaxis in labor   Hypertension    preeclampsia   Medical history non-contributory    Mental disorder    Supervision of other normal pregnancy, antepartum 08/19/2018    Nursing Staff Provider Office Location  [place] Dating   LMP Language   ENG Anatomy US  Normal Flu Vaccine  Declined Genetic Screen  Declined  TDaP vaccine   11/04/2018 Hgb A1C or  GTT Early  Third trimester  Rhogam   N/A: A POS   LAB RESULTS  Feeding Plan  breast Blood Type A/Positive/-- (01/22 1555)  Contraception  Depo Antibody Negative (01/22 1555) Circumcision Yes Rubella 3.68 (01/22 1555) Pediatri    Past Surgical History:  Procedure Laterality Date   CESAREAN SECTION N/A 01/22/2019   Procedure: CESAREAN SECTION;  Surgeon: Catalina Antigua, MD;  Location: [facility] LD ORS;  Service: Obstetrics;  Laterality: N/A;   TOENAIL EXCISION Bilateral 2021    Review of Systems:    All systems reviewed and negative except where noted in HPI.   Physical Examination:   BP 110/72   Pulse 89   Temp 98.5 F (36.9  C)   Ht 5\' 7"  (1.702 m)   Wt 181 lb 6.4 oz (82.3 kg)   BMI 28.41 kg/m   General: Well-nourished, well-developed in no acute distress.  Lungs: Clear to auscultation bilaterally. Non-labored. Heart: Regular rate and rhythm, no murmurs rubs or gallops.  Abdomen: Bowel sounds are normal; Abdomen is Soft; No hepatosplenomegaly, masses or hernias;  No Abdominal Tenderness; No guarding or rebound tenderness. Neuro: Alert and oriented x 3.  Grossly intact.  Psych: Alert and cooperative, normal mood and affect.   Imaging Studies: No results found.  Assessment and Plan:   Katrina Ortega is a 27 y.o. y/o female presents for evaluation of:  1.  Rectal bleeding  Scheduling Colonoscopy I discussed risks of colonoscopy with patient to include risk of bleeding, colon perforation, and risk of sedation.  Patient expressed understanding and agrees to proceed with colonoscopy.   2.  Chronic constipation with episodes of diarrhea  Start OTC Metamucil  3.  Abdominal pain - Epigastric and Generalized  H. Pylori Breath test  Labs:  CBC, CMP, Lipase   Scheduling EGD I discussed risks of EGD with patient to include risk of bleeding, perforation, and risk of sedation.  Patient expressed understanding and agrees to proceed with EGD.   4.  GERD  Rx Omeprazole 40mg  1 capsule daily.  5.  Irritable bowel syndrome  Start OTC Metamucil  6.  Hemorrhoids  Rx Hydrocortisone 25 Mg suppository 1 into rectum once daily for up to 2 weeks as needed for hemorrhoid symptoms.  Treatment for internal and external hemorrhoids was discussed at length.    Celso Amy, PA-C  Follow up 3 Months

## 2023-06-01 ENCOUNTER — Encounter: Payer: Self-pay | Admitting: Physician Assistant

## 2023-06-04 ENCOUNTER — Telehealth: Payer: Self-pay | Admitting: Physician Assistant

## 2023-06-04 NOTE — Telephone Encounter (Signed)
Cancel appointment with me 06/13/2023.  I just saw patient 05/30/2023 and she is scheduled for colonoscopy.  Please also advise patient to go ahead and get her labs and H. pylori test done that I ordered. Celso Amy, PA-C

## 2023-06-06 DIAGNOSIS — R1084 Generalized abdominal pain: Secondary | ICD-10-CM | POA: Diagnosis not present

## 2023-06-06 DIAGNOSIS — R1013 Epigastric pain: Secondary | ICD-10-CM | POA: Diagnosis not present

## 2023-06-06 DIAGNOSIS — K625 Hemorrhage of anus and rectum: Secondary | ICD-10-CM | POA: Diagnosis not present

## 2023-06-08 LAB — CBC WITH DIFFERENTIAL/PLATELET
Basophils Absolute: 0.1 10*3/uL (ref 0.0–0.2)
Basos: 1 %
EOS (ABSOLUTE): 0.1 10*3/uL (ref 0.0–0.4)
Eos: 2 %
Hematocrit: 39.6 % (ref 34.0–46.6)
Hemoglobin: 13.1 g/dL (ref 11.1–15.9)
Immature Grans (Abs): 0 10*3/uL (ref 0.0–0.1)
Immature Granulocytes: 0 %
Lymphocytes Absolute: 1.8 10*3/uL (ref 0.7–3.1)
Lymphs: 35 %
MCH: 30 pg (ref 26.6–33.0)
MCHC: 33.1 g/dL (ref 31.5–35.7)
MCV: 91 fL (ref 79–97)
Monocytes Absolute: 0.4 10*3/uL (ref 0.1–0.9)
Monocytes: 9 %
Neutrophils Absolute: 2.7 10*3/uL (ref 1.4–7.0)
Neutrophils: 53 %
Platelets: 266 10*3/uL (ref 150–450)
RBC: 4.36 x10E6/uL (ref 3.77–5.28)
RDW: 12.3 % (ref 11.7–15.4)
WBC: 5.1 10*3/uL (ref 3.4–10.8)

## 2023-06-08 LAB — COMPREHENSIVE METABOLIC PANEL
ALT: 16 [IU]/L (ref 0–32)
AST: 15 [IU]/L (ref 0–40)
Albumin: 4.5 g/dL (ref 4.0–5.0)
Alkaline Phosphatase: 88 [IU]/L (ref 44–121)
BUN/Creatinine Ratio: 15 (ref 9–23)
BUN: 12 mg/dL (ref 6–20)
Bilirubin Total: 0.3 mg/dL (ref 0.0–1.2)
CO2: 21 mmol/L (ref 20–29)
Calcium: 9.3 mg/dL (ref 8.7–10.2)
Chloride: 106 mmol/L (ref 96–106)
Creatinine, Ser: 0.81 mg/dL (ref 0.57–1.00)
Globulin, Total: 2.6 g/dL (ref 1.5–4.5)
Glucose: 91 mg/dL (ref 70–99)
Potassium: 4.7 mmol/L (ref 3.5–5.2)
Sodium: 140 mmol/L (ref 134–144)
Total Protein: 7.1 g/dL (ref 6.0–8.5)
eGFR: 101 mL/min/{1.73_m2} (ref >59)

## 2023-06-08 LAB — H. PYLORI BREATH TEST: H pylori Breath Test: POSITIVE — AB

## 2023-06-08 LAB — LIPASE: Lipase: 18 U/L (ref 14–72)

## 2023-06-09 ENCOUNTER — Telehealth: Payer: Self-pay

## 2023-06-09 MED ORDER — CLARITHROMYCIN 500 MG PO TABS
500.0000 mg | ORAL_TABLET | Freq: Two times a day (BID) | ORAL | 0 refills | Status: AC
Start: 2023-06-09 — End: 2023-06-23

## 2023-06-09 MED ORDER — AMOXICILLIN 500 MG PO TABS
1000.0000 mg | ORAL_TABLET | Freq: Two times a day (BID) | ORAL | 0 refills | Status: AC
Start: 2023-06-09 — End: 2023-06-23

## 2023-06-09 MED ORDER — OMEPRAZOLE 20 MG PO CPDR: 20.0000 mg | DELAYED_RELEASE_CAPSULE | Freq: Two times a day (BID) | ORAL | 0 refills | Status: DC

## 2023-06-09 NOTE — Telephone Encounter (Signed)
-----   Message from Celso Amy sent at 06/09/2023  8:40 AM EST ----- Call and notify patient labs show: 1.)  Normal CBC, CMP, and lipase.  Hemoglobin, kidney liver and pancreas test are all normal. 2.)  H. Pylori Test is Positive. Please start treatment for H Pylori for 14days: Omeprazole 20mg  BID Clarithromycin 500mg  BID Amoxicillin 1gm BID 3.)  Continue with plan for EGD and colonoscopy as scheduled 07/11/2023 with Dr. Tobi Bastos.  Schedule follow-up office visit with me 4 weeks after procedures and we will plan to repeat H. pylori test at that time. Celso Amy, PA-C

## 2023-06-09 NOTE — Telephone Encounter (Signed)
Called patient and patient verablized understanding of results. Sent medication to the pharmacy. Informed patient that while she was on the treatment to only take the omeprazole 20mg  not the 40mg  and then after the treatment she could do the 40mg . She made follow up appointment

## 2023-06-09 NOTE — Progress Notes (Signed)
Call and notify patient labs show: 1.)  Normal CBC, CMP, and lipase.  Hemoglobin, kidney liver and pancreas test are all normal. 2.)  H. Pylori Test is Positive. Please start treatment for H Pylori for 14days: Omeprazole 20mg  BID Clarithromycin 500mg  BID Amoxicillin 1gm BID 3.)  Continue with plan for EGD and colonoscopy as scheduled 07/11/2023 with Dr. Tobi Bastos.  Schedule follow-up office visit with me 4 weeks after procedures and we will plan to repeat H. pylori test at that time. Celso Amy, PA-C

## 2023-06-13 ENCOUNTER — Ambulatory Visit: Payer: Medicaid Other | Admitting: Physician Assistant

## 2023-07-10 ENCOUNTER — Telehealth: Payer: Self-pay

## 2023-07-10 NOTE — Telephone Encounter (Signed)
Patient has questions regarding drinking sodas while on liquid diet for upcoming procedure. Answers provided.

## 2023-07-11 ENCOUNTER — Ambulatory Visit: Payer: Medicaid Other | Admitting: Anesthesiology

## 2023-07-11 ENCOUNTER — Ambulatory Visit
Admission: RE | Admit: 2023-07-11 | Discharge: 2023-07-11 | Disposition: A | Discharge status: Home / Self Care | Payer: Medicaid Other | Source: Ambulatory Visit | Attending: Gastroenterology | Admitting: Gastroenterology

## 2023-07-11 ENCOUNTER — Telehealth: Payer: Self-pay

## 2023-07-11 ENCOUNTER — Encounter
Admission: RE | Disposition: A | Discharge status: Home / Self Care | Payer: Self-pay | Source: Ambulatory Visit | Attending: Gastroenterology

## 2023-07-11 ENCOUNTER — Telehealth: Payer: Self-pay | Admitting: Physician Assistant

## 2023-07-11 ENCOUNTER — Other Ambulatory Visit: Payer: Self-pay

## 2023-07-11 DIAGNOSIS — Z539 Procedure and treatment not carried out, unspecified reason: Secondary | ICD-10-CM | POA: Insufficient documentation

## 2023-07-11 DIAGNOSIS — K625 Hemorrhage of anus and rectum: Secondary | ICD-10-CM | POA: Diagnosis not present

## 2023-07-11 DIAGNOSIS — F17200 Nicotine dependence, unspecified, uncomplicated: Secondary | ICD-10-CM | POA: Diagnosis not present

## 2023-07-11 DIAGNOSIS — R1013 Epigastric pain: Secondary | ICD-10-CM | POA: Insufficient documentation

## 2023-07-11 DIAGNOSIS — K219 Gastro-esophageal reflux disease without esophagitis: Secondary | ICD-10-CM

## 2023-07-11 SURGERY — COLONOSCOPY WITH PROPOFOL
Anesthesia: General

## 2023-07-11 MED ORDER — SODIUM CHLORIDE 0.9 % IV SOLN: INTRAVENOUS | Status: DC

## 2023-07-11 MED ORDER — PEG 3350-KCL-NA BICARB-NACL 420 G PO SOLR: 4000.0000 mL | Freq: Once | ORAL | 0 refills | Status: AC

## 2023-07-11 NOTE — Telephone Encounter (Signed)
Dr. Tobi Bastos stated that she did not drink her prep, instead she took it to the endoscopy unit this morning. Can you please call her and reschedule her again. Please explain what she is needing to do with the prep. Thank you.

## 2023-07-11 NOTE — Telephone Encounter (Signed)
Patient calls office at this time- she stated she took half the jug of Golytely and did not follow the diet restrictions and showed up at Endoscopy and was told she needed to reschedule- she is currently scheduled 08-29-23 and instructed to follow diet restrictions . Mailed instructions to address and sent Golytely to pharmacy.

## 2023-07-11 NOTE — Telephone Encounter (Signed)
Patient called in to reschedule her procedure.

## 2023-07-11 NOTE — Anesthesia Preprocedure Evaluation (Deleted)
Anesthesia Evaluation  Patient identified by MRN, date of birth, ID band Patient awake    Reviewed: Allergy & Precautions, NPO status , Patient's Chart, lab work & pertinent test results  Airway Mallampati: II  TM Distance: >3 FB Neck ROM: full    Dental no notable dental hx.    Pulmonary neg pulmonary ROS, Current Smoker   Pulmonary exam normal        Cardiovascular negative cardio ROS Normal cardiovascular exam     Neuro/Psych negative neurological ROS  negative psych ROS   GI/Hepatic Neg liver ROS,GERD  ,,  Endo/Other  negative endocrine ROS    Renal/GU negative Renal ROS  negative genitourinary   Musculoskeletal   Abdominal   Peds  Hematology negative hematology ROS (+)   Anesthesia Other Findings Past Medical History: No date: Allergy No date: Anemia No date: Anxiety and depression No date: GERD (gastroesophageal reflux disease) 08/24/2018: Group beta Strep positive     Comment:  Identified in urine 01/22 Will require prophylaxis in               labor No date: Hypertension     Comment:  preeclampsia No date: Medical history non-contributory No date: Mental disorder 08/19/2018: Supervision of other normal pregnancy, antepartum     Comment:   Nursing Staff Provider Office Location  [place] Dating   LMP              Language   ENG Anatomy US  Normal Flu Vaccine                Declined Genetic Screen  Declined  TDaP vaccine                 11/04/2018 Hgb A1C or  GTT Early  Third trimester  Rhogam                N/A: A POS   LAB RESULTS  Feeding Plan  breast Blood               Type A/Positive/-- (01/22 1555)  Contraception                Depo Antibody Negative (01/22 1555)               Circumcision Yes Rubella 3.68 (01/22 1555) Pediatri  Past Surgical History: 01/22/2019: CESAREAN SECTION; N/A     Comment:  Procedure: CESAREAN SECTION;  Surgeon: Catalina Antigua,               MD;  Location: [facility] LD ORS;   Service: Obstetrics;                Laterality: N/A; 2021: TOENAIL EXCISION; Bilateral     Reproductive/Obstetrics negative OB ROS                             Anesthesia Physical Anesthesia Plan  ASA: 2  Anesthesia Plan: General   Post-op Pain Management: Minimal or no pain anticipated   Induction: Intravenous  PONV Risk Score and Plan: 2 and Propofol infusion and TIVA  Airway Management Planned: Natural Airway and Nasal Cannula  Additional Equipment:   Intra-op Plan:   Post-operative Plan:   Informed Consent: I have reviewed the patients History and Physical, chart, labs and discussed the procedure including the risks, benefits and alternatives for the proposed anesthesia with the patient or authorized representative who has indicated his/her understanding and acceptance.  Dental Advisory Given  Plan Discussed with: Anesthesiologist, CRNA and Surgeon  Anesthesia Plan Comments: (Patient consented for risks of anesthesia including but not limited to:  - adverse reactions to medications - risk of airway placement if required - damage to eyes, teeth, lips or other oral mucosa - nerve damage due to positioning  - sore throat or hoarseness - Damage to heart, brain, nerves, lungs, other parts of body or loss of life  Patient voiced understanding and assent.)       Anesthesia Quick Evaluation

## 2023-07-11 NOTE — OR Nursing (Signed)
Patient arrived holding prep that was still 3/4 full.  Did not really look at her directions until a couple of days ago. Very unprepared. I talked to her at length about the next time she is scheduled

## 2023-07-25 ENCOUNTER — Ambulatory Visit (INDEPENDENT_AMBULATORY_CARE_PROVIDER_SITE_OTHER): Payer: Medicaid Other

## 2023-07-25 ENCOUNTER — Other Ambulatory Visit (HOSPITAL_COMMUNITY)
Admission: RE | Admit: 2023-07-25 | Discharge: 2023-07-25 | Disposition: A | Discharge status: Home / Self Care | Payer: Medicaid Other | Source: Ambulatory Visit | Attending: Obstetrics and Gynecology | Admitting: Obstetrics and Gynecology

## 2023-07-25 VITALS — BP 114/68 | HR 87 | Ht 67.0 in | Wt 178.0 lb

## 2023-07-25 DIAGNOSIS — Z3042 Encounter for surveillance of injectable contraceptive: Secondary | ICD-10-CM

## 2023-07-25 DIAGNOSIS — N898 Other specified noninflammatory disorders of vagina: Secondary | ICD-10-CM | POA: Insufficient documentation

## 2023-07-25 MED ORDER — MEDROXYPROGESTERONE ACETATE 150 MG/ML IM SUSP
150.0000 mg | Freq: Once | INTRAMUSCULAR | Status: AC
  Administered 2023-07-25: 150 mg via INTRAMUSCULAR

## 2023-07-25 NOTE — Progress Notes (Signed)
    NURSE VISIT NOTE  Subjective:    Patient ID: Katrina Ortega, female    DOB: Jul 16, 1996, 27 y.o.   MRN: [id number]  HPI  Patient is a 27 y.o. G27P1001 female who presents for depo provera injection. Pt also requested a self swab for vaginal itching, started a few days ago, she's not sure if its from spotting that she has been having.    Objective:    BP 114/68   Pulse 87   Ht 5\' 7"  (1.702 m)   Wt 178 lb (80.7 kg)   BMI 27.88 kg/m   Last Annual: 10/08/22. Last pap: 10/08/22. Last Depo-Provera: 05/09/23. Side Effects if any: none. Serum HCG indicated? No . Depo-Provera 150 mg IM given by: Cornelius Moras, CMA. Site: Left Deltoid  Lab Review  @THIS  VISIT ONLY@  Assessment:   1. Encounter for surveillance of injectable contraceptive   2. Vaginal itching      Plan:   Next appointment due between 10/10/23 and 10/24/23. Results from self swab will go to Mychart, pt advised she can try Monistat, if itching gets worse before er have the results.      Cornelius Moras, CMA

## 2023-07-25 NOTE — Patient Instructions (Signed)
Contraceptive Injection A contraceptive injection is a shot that prevents pregnancy. It is also called a birth control shot. The shot contains the hormone progestin, which prevents pregnancy by: Stopping the ovaries from releasing eggs. Thickening cervical mucus to prevent sperm from entering the cervix. Thinning the lining of the uterus to prevent a fertilized egg from attaching to the uterus. Contraceptive injections are given under the skin (subcutaneous) or into a muscle (intramuscular). For these shots to work, you must get one of them every 3 months (12-13 weeks) from a health care provider. Tell a health care provider about: Any allergies you have. All medicines you are taking, including vitamins, herbs, eye drops, creams, and over-the-counter medicines. Any blood disorders you have. Any medical conditions you have. Whether you are pregnant or may be pregnant. What are the risks? Generally, this is a safe procedure. However, problems may occur, including: Mood changes or depression. Loss of bone density (osteoporosis) after long-term use. Blood clots. This is rare. Higher risk of an egg being fertilized outside your uterus (ectopic pregnancy).This is rare. What happens before the procedure? Your health care provider may do a routine physical exam. You may have a test to make sure you are not pregnant. What happens during the procedure?  The area where the shot will be given will be cleaned and sanitized with alcohol. A needle will be inserted into a muscle in your upper arm or buttock, or into the skin of your thigh or abdomen. The needle will be attached to a syringe with the medicine inside of it. The medicine will be pushed through the syringe and injected into your body. A small bandage (dressing) may be placed over the injection site. What can I expect after the procedure? After the procedure, it is common to have: Soreness around the injection site for a couple of  days. Irregular menstrual bleeding. Weight gain. Breast tenderness. Headaches. Discomfort in your abdomen. Ask your health care provider whether you need to use an added method of birth control (backup contraception), such as a condom, sponge, or spermicide. If the first shot is given 1-7 days after the start of your last menstrual period, you will not need backup contraception. If the first shot is given at any other time during your menstrual cycle, you should avoid having sex. If you do have sex, you will need to use backup contraception for 7 days after you receive the shot. Follow these instructions at home: General instructions Take over-the-counter and prescription medicines only as told by your health care provider. Do not rub or massage the injection site. Track your menstrual periods so you will know if they become irregular. Always use a condom to protect against sexually transmitted infections (STIs). Make sure you schedule an appointment in time for your next shot and mark it on your calendar. You must get an injection every 3 months (12-13 weeks) to prevent pregnancy. Lifestyle Do not use any products that contain nicotine or tobacco. These products include cigarettes, chewing tobacco, and vaping devices, such as e-cigarettes. If you need help quitting, ask your health care provider. Eat foods that are high in calcium and vitamin D, such as milk, cheese, and salmon. Doing this may help with any loss in bone density caused by the contraceptive injection. Ask your health care provider for dietary recommendations. Contact a health care provider if you: Have nausea or vomiting. Have abnormal vaginal discharge or bleeding. Miss a menstrual period or think you might be pregnant. Experience mood changes   or depression. Feel dizzy or light-headed. Have leg pain. Get help right away if you: Have chest pain or cough up blood. Have shortness of breath. Have a severe headache that does  not go away. Have numbness in any part of your body. Have slurred speech or vision problems. Have vaginal bleeding that is abnormally heavy or does not stop, or you have severe pain in your abdomen. Have depression that does not get better with treatment. If you ever feel like you may hurt yourself or others, or have thoughts about taking your own life, get help right away. Go to your nearest emergency department or: Call your local emergency services (911 in the [place] Call a suicide crisis helpline, such as the National Suicide Prevention Lifeline at 1-800-273-8255 or 988 in the [place] This is open 24 hours a day in the [place] Text the Crisis Text Line at 741741 (in the [place] Summary A contraceptive injection is a shot that prevents pregnancy. It is also called the birth control shot. The shot is given under the skin (subcutaneous) or into a muscle (intramuscular). After this procedure, it is common to have soreness around the injection site for a couple of days. To prevent pregnancy, the shot must be given by a health care provider every 3 months (12-13 weeks). After you have the shot, ask your health care provider whether you need to use an added method of birth control (backup contraception), such as a condom, sponge, or spermicide. This information is not intended to replace advice given to you by your health care provider. Make sure you discuss any questions you have with your health care provider. Document Revised: 02/07/2021 Document Reviewed: 01/24/2020 Elsevier Patient Education  2024 [employer] Inc.  

## 2023-07-28 LAB — CERVICOVAGINAL ANCILLARY ONLY
Bacterial Vaginitis (gardnerella): NEGATIVE
Candida Glabrata: NEGATIVE
Candida Vaginitis: NEGATIVE
Chlamydia: NEGATIVE
Comment: NEGATIVE
Comment: NEGATIVE
Comment: NEGATIVE
Comment: NEGATIVE
Comment: NEGATIVE
Comment: NORMAL
Neisseria Gonorrhea: NEGATIVE
Trichomonas: NEGATIVE

## 2023-07-31 ENCOUNTER — Encounter: Payer: Self-pay | Admitting: Obstetrics

## 2023-08-08 ENCOUNTER — Ambulatory Visit: Payer: Medicaid Other | Admitting: Physician Assistant

## 2023-08-21 ENCOUNTER — Ambulatory Visit: Payer: Medicaid Other | Admitting: Physician Assistant

## 2023-08-23 ENCOUNTER — Other Ambulatory Visit: Payer: Self-pay | Admitting: Family Medicine

## 2023-08-23 DIAGNOSIS — R3989 Other symptoms and signs involving the genitourinary system: Secondary | ICD-10-CM

## 2023-08-25 NOTE — Telephone Encounter (Signed)
Requested Prescriptions  Pending Prescriptions Disp Refills   amitriptyline (ELAVIL) 25 MG tablet [Pharmacy Med Name: AMITRIPTYLINE 25MG  TABLETS] 90 tablet 0    Sig: TAKE 1 TABLET(25 MG) BY MOUTH AT BEDTIME     Psychiatry:  Antidepressants - Heterocyclics (TCAs) Passed - 08/25/2023  1:50 PM      Passed - Completed PHQ-2 or PHQ-9 in the last 360 days      Passed - Valid encounter within last 6 months    Recent Outpatient Visits           3 months ago Acute non-recurrent sinusitis of other sinus   [place] [facility], Ocie Bob, MD   6 months ago Encounter for pre-employment health screening examination   [facility] at [facility], Ocie Bob, MD   8 months ago Healthcare maintenance   [place] [facility] at [facility], Ocie Bob, MD   11 months ago Lower abdominal pain   [place] [facility] at [facility], Ocie Bob, MD   1 year ago Bacterial vaginosis   [place] [facility] at [facility], Ocie Bob, MD       Future Appointments             In 3 months Ashley Royalty, Ocie Bob, MD [facility] at [facility], [facility]

## 2023-08-26 DIAGNOSIS — A084 Viral intestinal infection, unspecified: Secondary | ICD-10-CM | POA: Diagnosis not present

## 2023-08-26 DIAGNOSIS — Z3202 Encounter for pregnancy test, result negative: Secondary | ICD-10-CM | POA: Diagnosis not present

## 2023-08-26 DIAGNOSIS — R112 Nausea with vomiting, unspecified: Secondary | ICD-10-CM | POA: Diagnosis not present

## 2023-08-27 ENCOUNTER — Telehealth: Payer: Self-pay | Admitting: *Deleted

## 2023-08-27 NOTE — Telephone Encounter (Signed)
Patient called endo unit stating she is trying to get over the norovirus. Patient then was transfer to me. She was still having diarrhea. Inform patient that it will be best for patient to reschedule her procedures (EGD AND COLONOSCOPY).  Requesting to reschedule on 10/24/2023 with Dr Tobi Bastos at [facility].  New instructions will be sent.

## 2023-09-25 ENCOUNTER — Ambulatory Visit: Payer: Medicaid Other | Admitting: Physician Assistant

## 2023-09-25 NOTE — Progress Notes (Deleted)
 Celso Amy, PA-C [address]  [address]  [place], [place] [address]  Main: [phone]  Fax: [phone]   Primary Care Physician: Jerrol Banana, MD  Primary Gastroenterologist:  ***  CC: F/U rectal bleeding, H. pylori, GERD, abdominal cramping, diarrhea, constipation  HPI: Katrina Ortega is a 28 y.o. female  She was scheduled for EGD and colonoscopy in December, however she canceled procedures.  She has been rescheduled for EGD and colonoscopy 10/24/2023.  05/2023: H. pylori breath test positive.  Treated with omeprazole 20 Mg twice daily, clarithromycin 500 Mg twice daily, amoxicillin 1 g twice daily.  05/2023 labs: CBC, CMP, lipase all normal.  Hemoglobin 13.1.  Last office visit 05/2023: Having blood in stool for several days.  Acid reflux, abdominal cramping, diarrhea alternating with constipation for 1 year.  Hemorrhoids for years.  Treated with omeprazole 40 Mg once daily, hydrocortisone suppository, and Metamucil.  Current Outpatient Medications  Medication Sig Dispense Refill   amitriptyline (ELAVIL) 25 MG tablet TAKE 1 TABLET(25 MG) BY MOUTH AT BEDTIME 90 tablet 0   azithromycin (ZITHROMAX) 250 MG tablet Take by mouth daily.     cetirizine (ZYRTEC ALLERGY) 10 MG tablet Take 1 tablet (10 mg total) by mouth daily. 30 tablet 2   hydrocortisone (ANUSOL-HC) 2.5 % rectal cream Place 1 Application rectally 2 (two) times daily. 30 g 1   naproxen (NAPROSYN) 500 MG tablet Take by mouth.     omeprazole (PRILOSEC) 20 MG capsule Take 1 capsule (20 mg total) by mouth 2 (two) times daily before a meal for 14 days. 28 capsule 0   omeprazole (PRILOSEC) 40 MG capsule Take 1 capsule (40 mg total) by mouth 2 (two) times daily. 60 capsule 2   polyethylene glycol powder (GLYCOLAX/MIRALAX) 17 GM/SCOOP powder Mix 1 capful in a Drink Once daily for Constipation.     tiZANidine (ZANAFLEX) 4 MG tablet Take by mouth.     Vitamin D, Ergocalciferol, (DRISDOL) 1.25 MG (50000 UNIT)  CAPS capsule Take 1 capsule (50,000 Units total) by mouth every 7 (seven) days. Take for 8 total doses(weeks) 8 capsule 0   Current Facility-Administered Medications  Medication Dose Route Frequency Provider Last Rate Last Admin   medroxyPROGESTERone (DEPO-PROVERA) injection 150 mg  150 mg Intramuscular Q90 days Copland, Alicia B, PA-C   150 mg at 02/11/23 1030    Allergies as of 09/25/2023 - Review Complete 08/22/2023  Allergen Reaction Noted   Percocet [oxycodone-acetaminophen] Itching 09/28/2016    Past Medical History:  Diagnosis Date   Allergy    Anemia    Anxiety and depression    GERD (gastroesophageal reflux disease)    Group beta Strep positive 08/24/2018   Identified in urine 01/22 Will require prophylaxis in labor   Hypertension    preeclampsia   Medical history non-contributory    Mental disorder    Supervision of other normal pregnancy, antepartum 08/19/2018    Nursing Staff Provider Office Location  [place] Dating   LMP Language   ENG Anatomy US  Normal Flu Vaccine  Declined Genetic Screen  Declined  TDaP vaccine   11/04/2018 Hgb A1C or  GTT Early  Third trimester  Rhogam   N/A: A POS   LAB RESULTS  Feeding Plan  breast Blood Type A/Positive/-- (01/22 1555)  Contraception  Depo Antibody Negative (01/22 1555) Circumcision Yes Rubella 3.68 (01/22 1555) Pediatri    Past Surgical History:  Procedure Laterality Date   CESAREAN SECTION N/A 01/22/2019   Procedure:  CESAREAN SECTION;  Surgeon: Catalina Antigua, MD;  Location: [facility];  Service: Obstetrics;  Laterality: N/A;   TOENAIL EXCISION Bilateral 2021    Review of Systems:    All systems reviewed and negative except where noted in HPI.   Physical Examination:   There were no vitals taken for this visit.  General: Well-nourished, well-developed in no acute distress.  Lungs: Clear to auscultation bilaterally. Non-labored. Heart: Regular rate and rhythm, no murmurs rubs or gallops.  Abdomen: Bowel sounds are normal;  Abdomen is Soft; No hepatosplenomegaly, masses or hernias;  No Abdominal Tenderness; No guarding or rebound tenderness. Neuro: Alert and oriented x 3.  Grossly intact.  Psych: Alert and cooperative, normal mood and affect.   Imaging Studies: No results found.  Assessment and Plan:   Katrina Ortega is a 29 y.o. y/o female   1.  H. pylori infection; treated 05/2023 with omeprazole, clarithromycin, amoxicillin.  Repeat H. pylori breath test off PPI for 2 weeks  2.  Rectal bleeding  Continue with plan for colonoscopy as scheduled  3.  GERD  Continue with plan for EGD as scheduled  Continue GERD treatment  4.  Irritable bowel syndrome, mixed  Fiber supplement, probiotic, IBgard, low FODMAP diet   Celso Amy, PA-C  Follow up ***  BP check ***

## 2023-10-17 ENCOUNTER — Other Ambulatory Visit (HOSPITAL_COMMUNITY)
Admission: RE | Admit: 2023-10-17 | Discharge: 2023-10-17 | Disposition: A | Discharge status: Home / Self Care | Source: Ambulatory Visit | Attending: Obstetrics and Gynecology | Admitting: Obstetrics and Gynecology

## 2023-10-17 ENCOUNTER — Ambulatory Visit (INDEPENDENT_AMBULATORY_CARE_PROVIDER_SITE_OTHER): Payer: Medicaid Other

## 2023-10-17 VITALS — BP 120/73 | HR 83 | Ht 67.0 in | Wt 179.2 lb

## 2023-10-17 DIAGNOSIS — Z113 Encounter for screening for infections with a predominantly sexual mode of transmission: Secondary | ICD-10-CM

## 2023-10-17 DIAGNOSIS — Z3042 Encounter for surveillance of injectable contraceptive: Secondary | ICD-10-CM | POA: Diagnosis not present

## 2023-10-17 DIAGNOSIS — N898 Other specified noninflammatory disorders of vagina: Secondary | ICD-10-CM

## 2023-10-17 MED ORDER — MEDROXYPROGESTERONE ACETATE 150 MG/ML IM SUSP
150.0000 mg | Freq: Once | INTRAMUSCULAR | Status: AC
Start: 2023-10-17 — End: 2023-10-17
  Administered 2023-10-17: 150 mg via INTRAMUSCULAR

## 2023-10-17 NOTE — Progress Notes (Signed)
    NURSE VISIT NOTE  Subjective:    Patient ID: Katrina Ortega, female    DOB: 06-21-1996, 28 y.o.   MRN: [id number]  HPI  Patient is a 28 y.o. G34P1001 female who presents for depo provera injection. She is also requested a vaginal self culture due to vaginal irration and discharge for the last 5-7 days.    Objective:    BP 120/73   Pulse 83   Ht 5\' 7"  (1.702 m)   Wt 179 lb 3.2 oz (81.3 kg)   BMI 28.07 kg/m   Last Annual: 10/08/22. Last pap: 10/08/22. Last Depo-Provera: 07/25/23. Side Effects if any: n/a. Serum HCG indicated? No . Depo-Provera 150 mg IM given by: Georgiana Shore, CMA. Site: Left Deltoid   Assessment:   1. Encounter for Depo-Provera contraception   2. Screening for STD (sexually transmitted disease)   3. Vaginal discharge      Plan:   Next appointment due between 01/02/24 and 01/16/24. Await results via MyChart for further treatment.    Loman Chroman, CMA

## 2023-10-20 LAB — CERVICOVAGINAL ANCILLARY ONLY
Bacterial Vaginitis (gardnerella): NEGATIVE
Candida Glabrata: NEGATIVE
Candida Vaginitis: NEGATIVE
Chlamydia: NEGATIVE
Comment: NEGATIVE
Comment: NEGATIVE
Comment: NEGATIVE
Comment: NEGATIVE
Comment: NEGATIVE
Comment: NORMAL
Neisseria Gonorrhea: NEGATIVE
Trichomonas: NEGATIVE

## 2023-10-24 ENCOUNTER — Ambulatory Visit: Admission: RE | Admit: 2023-10-24 | Payer: Medicaid Other | Source: Home / Self Care | Admitting: Gastroenterology

## 2023-10-24 ENCOUNTER — Encounter: Payer: Self-pay | Admitting: Certified Registered"

## 2023-10-24 SURGERY — COLONOSCOPY WITH PROPOFOL
Anesthesia: General

## 2023-11-06 NOTE — Patient Instructions (Signed)
 Preventive Care 29-29 Years Old, Female Preventive care refers to lifestyle choices and visits with your health care provider that can promote health and wellness. Preventive care visits are also called wellness exams. What can I expect for my preventive care visit? Counseling During your preventive care visit, your health care provider may ask about your: Medical history, including: Past medical problems. Family medical history. Pregnancy history. Current health, including: Menstrual cycle. Method of birth control. Emotional well-being. Home life and relationship well-being. Sexual activity and sexual health. Lifestyle, including: Alcohol, nicotine or tobacco, and drug use. Access to firearms. Diet, exercise, and sleep habits. Work and work Astronomer. Sunscreen use. Safety issues such as seatbelt and bike helmet use. Physical exam Your health care provider may check your: Height and weight. These may be used to calculate your BMI (body mass index). BMI is a measurement that tells if you are at a healthy weight. Waist circumference. This measures the distance around your waistline. This measurement also tells if you are at a healthy weight and may help predict your risk of certain diseases, such as type 2 diabetes and high blood pressure. Heart rate and blood pressure. Body temperature. Skin for abnormal spots. What immunizations do I need?  Vaccines are usually given at various ages, according to a schedule. Your health care provider will recommend vaccines for you based on your age, medical history, and lifestyle or other factors, such as travel or where you work. What tests do I need? Screening Your health care provider may recommend screening tests for certain conditions. This may include: Pelvic exam and Pap test. Lipid and cholesterol levels. Diabetes screening. This is done by checking your blood sugar (glucose) after you have not eaten for a while (fasting). Hepatitis  B test. Hepatitis C test. HIV (human immunodeficiency virus) test. STI (sexually transmitted infection) testing, if you are at risk. BRCA-related cancer screening. This may be done if you have a family history of breast, ovarian, tubal, or peritoneal cancers. Talk with your health care provider about your test results, treatment options, and if necessary, the need for more tests. Follow these instructions at home: Eating and drinking  Eat a healthy diet that includes fresh fruits and vegetables, whole grains, lean protein, and low-fat dairy products. Take vitamin and mineral supplements as recommended by your health care provider. Do not drink alcohol if: Your health care provider tells you not to drink. You are pregnant, may be pregnant, or are planning to become pregnant. If you drink alcohol: Limit how much you have to 0-1 drink a day. Know how much alcohol is in your drink. In the [place] one drink equals one 12 oz bottle of beer (355 mL), one 5 oz glass of wine (148 mL), or one 1 oz glass of hard liquor (44 mL). Lifestyle Brush your teeth every morning and night with fluoride toothpaste. Floss one time each day. Exercise for at least 30 minutes 5 or more days each week. Do not use any products that contain nicotine or tobacco. These products include cigarettes, chewing tobacco, and vaping devices, such as e-cigarettes. If you need help quitting, ask your health care provider. Do not use drugs. If you are sexually active, practice safe sex. Use a condom or other form of protection to prevent STIs. If you do not wish to become pregnant, use a form of birth control. If you plan to become pregnant, see your health care provider for a prepregnancy visit. Find healthy ways to manage stress, such as: Meditation,  yoga, or listening to music. Journaling. Talking to a trusted person. Spending time with friends and family. Minimize exposure to UV radiation to reduce your risk of skin  cancer. Safety Always wear your seat belt while driving or riding in a vehicle. Do not drive: If you have been drinking alcohol. Do not ride with someone who has been drinking. If you have been using any mind-altering substances or drugs. While texting. When you are tired or distracted. Wear a helmet and other protective equipment during sports activities. If you have firearms in your house, make sure you follow all gun safety procedures. Seek help if you have been physically or sexually abused. What's next? Go to your health care provider once a year for an annual wellness visit. Ask your health care provider how often you should have your eyes and teeth checked. Stay up to date on all vaccines. This information is not intended to replace advice given to you by your health care provider. Make sure you discuss any questions you have with your health care provider. Document Revised: 01/10/2021 Document Reviewed: 01/10/2021 Elsevier Patient Education  2024 Elsevier Inc. Breast Self-Awareness Breast self-awareness is knowing how your breasts look and feel. You need to: Check your breasts on a regular basis. Tell your doctor about any changes. Become familiar with the look and feel of your breasts. This can help you catch a breast problem while it is still small and can be treated. You should do breast self-exams even if you have breast implants. What you need: A mirror. A well-lit room. A pillow or other soft object. How to do a breast self-exam Follow these steps to do a breast self-exam: Look for changes  Take off all the clothes above your waist. Stand in front of a mirror in a room with good lighting. Put your hands down at your sides. Compare your breasts in the mirror. Look for any difference between them, such as: A difference in shape. A difference in size. Wrinkles, dips, and bumps in one breast and not the other. Look at each breast for changes in the skin, such  as: Redness. Scaly areas. Skin that has gotten thicker. Dimpling. Open sores (ulcers). Look for changes in your nipples, such as: Fluid coming out of a nipple. Fluid around a nipple. Bleeding. Dimpling. Redness. A nipple that looks pushed in (retracted), or that has changed position. Feel for changes Lie on your back. Feel each breast. To do this: Pick a breast to feel. Place a pillow under the shoulder closest to that breast. Put the arm closest to that breast behind your head. Feel the nipple area of that breast using the hand of your other arm. Feel the area with the pads of your three middle fingers by making small circles with your fingers. Use light, medium, and firm pressure. Continue the overlapping circles, moving downward over the breast. Keep making circles with your fingers. Stop when you feel your ribs. Start making circles with your fingers again, this time going upward until you reach your collarbone. Then, make circles outward across your breast and into your armpit area. Squeeze your nipple. Check for discharge and lumps. Repeat these steps to check your other breast. Sit or stand in the tub or shower. With soapy water on your skin, feel each breast the same way you did when you were lying down. Write down what you find Writing down what you find can help you remember what to tell your doctor. Write down: What is  normal for each breast. Any changes you find in each breast. These include: The kind of changes you find. A tender or painful breast. Any lump you find. Write down its size and where it is. When you last had your monthly period (menstrual cycle). General tips If you are breastfeeding, the best time to check your breasts is after you feed your baby or after you use a breast pump. If you get monthly bleeding, the best time to check your breasts is 5-7 days after your monthly cycle ends. With time, you will become comfortable with the self-exam. You will  also start to know if there are changes in your breasts. Contact a doctor if: You see a change in the shape or size of your breasts or nipples. You see a change in the skin of your breast or nipples, such as red or scaly skin. You have fluid coming from your nipples that is not normal. You find a new lump or thick area. You have breast pain. You have any concerns about your breast health. Summary Breast self-awareness includes looking for changes in your breasts and feeling for changes within your breasts. You should do breast self-awareness in front of a mirror in a well-lit room. If you get monthly periods (menstrual cycles), the best time to check your breasts is 5-7 days after your period ends. Tell your doctor about any changes you see in your breasts. Changes include changes in size, changes on the skin, painful or tender breasts, or fluid from your nipples that is not normal. This information is not intended to replace advice given to you by your health care provider. Make sure you discuss any questions you have with your health care provider. Document Revised: 12/20/2021 Document Reviewed: 05/17/2021 Elsevier Patient Education  2024 [employer].
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
## 2023-11-07 ENCOUNTER — Ambulatory Visit: Admitting: Obstetrics and Gynecology

## 2023-11-07 ENCOUNTER — Encounter: Payer: Self-pay | Admitting: Obstetrics and Gynecology

## 2023-11-07 ENCOUNTER — Other Ambulatory Visit (HOSPITAL_COMMUNITY)
Admission: RE | Admit: 2023-11-07 | Discharge: 2023-11-07 | Disposition: A | Discharge status: Home / Self Care | Source: Ambulatory Visit | Attending: Obstetrics and Gynecology | Admitting: Obstetrics and Gynecology

## 2023-11-07 VITALS — BP 109/62 | HR 104 | Ht 67.0 in | Wt 181.8 lb

## 2023-11-07 DIAGNOSIS — Z01419 Encounter for gynecological examination (general) (routine) without abnormal findings: Secondary | ICD-10-CM | POA: Diagnosis not present

## 2023-11-07 DIAGNOSIS — Z0000 Encounter for general adult medical examination without abnormal findings: Secondary | ICD-10-CM

## 2023-11-07 DIAGNOSIS — R87610 Atypical squamous cells of undetermined significance on cytologic smear of cervix (ASC-US): Secondary | ICD-10-CM

## 2023-11-07 DIAGNOSIS — Z3042 Encounter for surveillance of injectable contraceptive: Secondary | ICD-10-CM

## 2023-11-07 NOTE — Addendum Note (Signed)
 Addended by: Tommie Raymond on: 11/07/2023 02:11 PM   Modules accepted: Orders

## 2023-11-14 LAB — CYTOLOGY - PAP

## 2023-11-18 ENCOUNTER — Encounter: Payer: Self-pay | Admitting: Obstetrics and Gynecology

## 2023-12-09 ENCOUNTER — Encounter: Admitting: Obstetrics and Gynecology

## 2023-12-09 DIAGNOSIS — R87612 Low grade squamous intraepithelial lesion on cytologic smear of cervix (LGSIL): Secondary | ICD-10-CM

## 2023-12-16 ENCOUNTER — Ambulatory Visit (INDEPENDENT_AMBULATORY_CARE_PROVIDER_SITE_OTHER): Payer: Self-pay | Admitting: Family Medicine

## 2023-12-16 ENCOUNTER — Encounter: Payer: Self-pay | Admitting: Family Medicine

## 2023-12-16 VITALS — BP 102/68 | HR 90 | Ht 67.0 in | Wt 183.4 lb

## 2023-12-16 DIAGNOSIS — K2100 Gastro-esophageal reflux disease with esophagitis, without bleeding: Secondary | ICD-10-CM | POA: Diagnosis not present

## 2023-12-16 DIAGNOSIS — Z0000 Encounter for general adult medical examination without abnormal findings: Secondary | ICD-10-CM

## 2023-12-16 DIAGNOSIS — F419 Anxiety disorder, unspecified: Secondary | ICD-10-CM

## 2023-12-16 DIAGNOSIS — J020 Streptococcal pharyngitis: Secondary | ICD-10-CM

## 2023-12-16 DIAGNOSIS — M4727 Other spondylosis with radiculopathy, lumbosacral region: Secondary | ICD-10-CM | POA: Diagnosis not present

## 2023-12-16 DIAGNOSIS — F32A Depression, unspecified: Secondary | ICD-10-CM | POA: Diagnosis not present

## 2023-12-16 DIAGNOSIS — R3989 Other symptoms and signs involving the genitourinary system: Secondary | ICD-10-CM | POA: Diagnosis not present

## 2023-12-16 MED ORDER — VENLAFAXINE HCL ER 37.5 MG PO CP24: ORAL_CAPSULE | ORAL | 0 refills | Status: DC

## 2023-12-16 MED ORDER — VENLAFAXINE HCL ER 75 MG PO CP24: 75.0000 mg | ORAL_CAPSULE | Freq: Every day | ORAL | 1 refills | Status: DC

## 2023-12-17 ENCOUNTER — Encounter: Payer: Self-pay | Admitting: Family Medicine

## 2023-12-17 NOTE — Telephone Encounter (Signed)
FYI for referrals.

## 2023-12-18 ENCOUNTER — Ambulatory Visit: Payer: Self-pay | Admitting: Family Medicine

## 2023-12-18 DIAGNOSIS — J020 Streptococcal pharyngitis: Secondary | ICD-10-CM | POA: Insufficient documentation

## 2023-12-18 LAB — URINE CULTURE

## 2023-12-18 NOTE — Assessment & Plan Note (Signed)
 She describes numbness in her legs after sitting for about thirty minutes, which sometimes resolves upon standing. Visible veins are noted on the back of her legs. She also experiences discomfort in her low back and buttock region, which is tender to touch. Occasionally, her left arm goes numb, which she attributes to possibly needing to eat something. She is currently taking naproxen and a muscle relaxer for her back pain.  Low back pain Chronic low back pain with leg numbness. - Order MRI of the low back to evaluate for disc involvement. - Discuss post-MRI treatment options, including physical therapy or injections.

## 2023-12-18 NOTE — Patient Instructions (Signed)
-   Obtain fasting labs with orders provided (can have water or black coffee but otherwise no food or drink x 8 hours before labs) - Review information provided - Attend eye doctor annually, dentist every 6 months, work towards or maintain 30 minutes of moderate intensity physical activity at least 5 days per week, and consume a balanced diet - Return in 1 year for physical - Contact us  for any questions between now and then   Patient Plan  1. Anxiety and Depression    - Begin taking venlafaxine (Effexor) at 37.5 mg daily for 3 days, then increase to 75 mg daily.    - Take your medication at the same time each day.    - Schedule a follow-up video appointment in 4 weeks to assess how you are doing with the medication.  2. Gastrointestinal Symptoms    - Increase your intake of fiber, salads, and water.    - Arrange a visit with a new gastroenterologist closer to your home.    - Go to the emergency room if you experience severe pain in the right lower abdomen.  3. Low Back Pain    - An MRI of your lower back has been ordered to check for any disc problems.    - After the MRI, discuss treatment options like physical therapy or injections with your doctor.  4. Healthcare Maintenance    - Complete your annual lab tests, including CBC, comprehensive metabolic panel, hemoglobin A1c, and lipid panel.  5. Recurrent Streptococcal Pharyngitis    - Follow up with an ENT specialist as referred.  Red Flags: - Contact your healthcare provider immediately if you experience severe abdominal pain, worsening numbness, or any new symptoms.

## 2023-12-18 NOTE — Progress Notes (Signed)
 Annual Physical Exam Visit  Patient Information:  Patient ID: Katrina Ortega, female DOB: 07/17/1996 Age: 28 y.o. MRN: [id number]   Subjective:   CC: Annual Physical Exam  HPI:  Katrina Ortega is here for their annual physical.  I reviewed the past medical history, family history, social history, surgical history, and allergies today and changes were made as necessary.  Please see the problem list section below for additional details.  Past Medical History: Past Medical History:  Diagnosis Date   Allergy    Anemia    Anxiety and depression    GERD (gastroesophageal reflux disease)    Group beta Strep positive 08/24/2018   Identified in urine 01/22 Will require prophylaxis in labor   Hypertension    preeclampsia   Medical history non-contributory    Mental disorder    Supervision of other normal pregnancy, antepartum 08/19/2018    Nursing Staff Provider Office Location  [place] Dating   LMP Language   ENG Anatomy US   Normal Flu Vaccine  Declined Genetic Screen  Declined  TDaP vaccine   11/04/2018 Hgb A1C or  GTT Early  Third trimester  Rhogam   N/A: A POS   LAB RESULTS  Feeding Plan  breast Blood Type A/Positive/-- (01/22 1555)  Contraception  Depo Antibody Negative (01/22 1555) Circumcision Yes Rubella 3.68 (01/22 1555) Pediatri   Past Surgical History: Past Surgical History:  Procedure Laterality Date   CESAREAN SECTION N/A 01/22/2019   Procedure: CESAREAN SECTION;  Surgeon: Verlyn Goad, MD;  Location: [facility] LD ORS;  Service: Obstetrics;  Laterality: N/A;   TOENAIL EXCISION Bilateral 2021   Family History: Family History  Problem Relation Age of Onset   Multiple sclerosis Mother    Migraines Mother    Hypertension Father    Migraines Father    Other Father        tumor "in front of brain"   Diabetes Paternal Uncle    Schizophrenia Maternal Grandmother    Diabetes Maternal Grandfather    Diabetes Paternal Grandmother    Hypertension Paternal Grandmother    Eczema  Son    Breast cancer Maternal Great-grandmother        mid to late years   Allergies: Allergies  Allergen Reactions   Percocet [Oxycodone -Acetaminophen ] Itching   Health Maintenance: Health Maintenance  Topic Date Due   COVID-19 Vaccine (3 - 2024-25 season) 01/01/2024 (Originally 03/30/2023)   Pneumococcal Vaccine 50-66 Years old (1 of 2 - PCV) 12/15/2024 (Originally 10/09/2013)   INFLUENZA VACCINE  02/27/2024   Cervical Cancer Screening (Pap smear)  11/07/2026   DTaP/Tdap/Td (2 - Td or Tdap) 11/03/2028   HPV VACCINES  Completed   Hepatitis C Screening  Completed   HIV Screening  Completed   Meningococcal B Vaccine  Aged Out    HM Colonoscopy   This patient has no relevant Health Maintenance data.    Medications: Current Outpatient Medications on File Prior to Visit  Medication Sig Dispense Refill   amitriptyline  (ELAVIL ) 25 MG tablet TAKE 1 TABLET(25 MG) BY MOUTH AT BEDTIME 90 tablet 0   naproxen (NAPROSYN) 500 MG tablet Take by mouth.     tiZANidine (ZANAFLEX) 4 MG tablet Take by mouth.     Vitamin D , Ergocalciferol , (DRISDOL ) 1.25 MG (50000 UNIT) CAPS capsule Take 1 capsule (50,000 Units total) by mouth every 7 (seven) days. Take for 8 total doses(weeks) 8 capsule 0   cetirizine  (ZYRTEC  ALLERGY) 10 MG tablet Take 1 tablet (10 mg  total) by mouth daily. (Patient not taking: Reported on 12/16/2023) 30 tablet 2   hydrocortisone  (ANUSOL -HC) 2.5 % rectal cream Place 1 Application rectally 2 (two) times daily. (Patient not taking: Reported on 12/16/2023) 30 g 1   omeprazole  (PRILOSEC) 40 MG capsule Take 1 capsule (40 mg total) by mouth 2 (two) times daily. 60 capsule 2   No current facility-administered medications on file prior to visit.    Objective:   Vitals:   12/16/23 1014  BP: 102/68  Pulse: 90  SpO2: 98%   Vitals:   12/16/23 1014  Weight: 183 lb 6.4 oz (83.2 kg)  Height: 5\' 7"  (1.702 m)   Body mass index is 28.72 kg/m.  General: Well Developed, well nourished,  and in no acute distress.  Neuro: Alert and oriented x3, extra-ocular muscles intact, sensation grossly intact. Cranial nerves II through XII are grossly intact, motor, sensory, and coordinative functions are intact. HEENT: Normocephalic, atraumatic, neck supple, no masses, no lymphadenopathy, thyroid  nonenlarged. Oropharynx, nasopharynx, external ear canals are unremarkable. Skin: Warm and dry, no rashes noted.  Cardiac: Regular rate and rhythm, no murmurs rubs or gallops. No peripheral edema. Pulses symmetric. Respiratory: Clear to auscultation bilaterally. Speaking in full sentences.  Abdominal: Soft, nontender, nondistended, positive bowel sounds, no masses, no organomegaly. Musculoskeletal: Stable, and with full range of motion.  Impression and Recommendations:   The patient was counselled, risk factors were discussed, and anticipatory guidance given.  Problem List Items Addressed This Visit     Anxiety and depression   Stress and concentration issues Difficulty with focus and stress management affecting academics. Effexor discussed for stress, concentration, and potential ADHD benefits. - Prescribe venlafaxine (Effexor) with initial low (37.5 mg) dose for 3 days, then increase to 75 mg dose. - Schedule follow-up video visit in 4 weeks to assess medication response. - Emphasize consistency in medication timing.      Relevant Medications   venlafaxine XR (EFFEXOR XR) 37.5 MG 24 hr capsule   venlafaxine XR (EFFEXOR XR) 75 MG 24 hr capsule   Bladder pain   Relevant Orders   Urine Culture (Completed)   Gastroesophageal reflux disease with esophagitis without hemorrhage   She experiences stomach cramps, rectal bleeding, heartburn, nausea, constipation, and diarrhea, which have been ongoing for quite some time. Blood is sometimes noticed when wiping. An endoscopy and colonoscopy were scheduled but rescheduled due to confusion with dietary instructions. She uses fiber supplements to  manage bowel symptoms and is trying to increase her intake of salads and water.  Gastrointestinal symptoms Chronic gastrointestinal symptoms suggestive of IBS or IBD. Previous gastroenterology referral incomplete. - Refer to a different gastroenterologist closer to her residence at her request. - Advise regular fiber intake. - Encourage increased salads and water intake. - Advise emergency care if severe right lower quadrant pain occurs.      Relevant Orders   Ambulatory referral to Gastroenterology   Healthcare maintenance - Primary   Annual examination completed, risk stratification labs ordered, anticipatory guidance provided.  We will follow labs once resulted.      Relevant Orders   CBC   Comprehensive metabolic panel with GFR   Hemoglobin A1c   Lipid panel   Lumbosacral spondylosis with radiculopathy   She describes numbness in her legs after sitting for about thirty minutes, which sometimes resolves upon standing. Visible veins are noted on the back of her legs. She also experiences discomfort in her low back and buttock region, which is tender to touch. Occasionally, her  left arm goes numb, which she attributes to possibly needing to eat something. She is currently taking naproxen and a muscle relaxer for her back pain.  Low back pain Chronic low back pain with leg numbness. - Order MRI of the low back to evaluate for disc involvement. - Discuss post-MRI treatment options, including physical therapy or injections.      Relevant Medications   venlafaxine XR (EFFEXOR XR) 37.5 MG 24 hr capsule   venlafaxine XR (EFFEXOR XR) 75 MG 24 hr capsule   Other Relevant Orders   MR LUMBAR SPINE WO CONTRAST   Recurrent streptococcal pharyngitis   Relevant Orders   Ambulatory referral to ENT     Orders & Medications Medications:  Meds ordered this encounter  Medications   venlafaxine XR (EFFEXOR XR) 37.5 MG 24 hr capsule    Sig: One tab by mouth daily for 3 days then switch to  the higher dose    Dispense:  3 capsule    Refill:  0   venlafaxine XR (EFFEXOR XR) 75 MG 24 hr capsule    Sig: Take 1 capsule (75 mg total) by mouth daily with breakfast.    Dispense:  30 capsule    Refill:  1   Orders Placed This Encounter  Procedures   Urine Culture   MR LUMBAR SPINE WO CONTRAST   CBC   Comprehensive metabolic panel with GFR   Hemoglobin A1c   Lipid panel   Ambulatory referral to Gastroenterology   Ambulatory referral to ENT     Return in about 4 weeks (around 01/13/2024) for MyChart Video Visit and 1 year for CPE.    Ma Saupe, MD, [facility]   Primary Care Sports Medicine Primary Care and Sports Medicine at [facility]

## 2023-12-18 NOTE — Assessment & Plan Note (Signed)
 She experiences stomach cramps, rectal bleeding, heartburn, nausea, constipation, and diarrhea, which have been ongoing for quite some time. Blood is sometimes noticed when wiping. An endoscopy and colonoscopy were scheduled but rescheduled due to confusion with dietary instructions. She uses fiber supplements to manage bowel symptoms and is trying to increase her intake of salads and water.  Gastrointestinal symptoms Chronic gastrointestinal symptoms suggestive of IBS or IBD. Previous gastroenterology referral incomplete. - Refer to a different gastroenterologist closer to her residence at her request. - Advise regular fiber intake. - Encourage increased salads and water intake. - Advise emergency care if severe right lower quadrant pain occurs.

## 2023-12-18 NOTE — Assessment & Plan Note (Signed)
 Annual examination completed, risk stratification labs ordered, anticipatory guidance provided.  We will follow labs once resulted.

## 2023-12-18 NOTE — Assessment & Plan Note (Signed)
 Stress and concentration issues Difficulty with focus and stress management affecting academics. Effexor discussed for stress, concentration, and potential ADHD benefits. - Prescribe venlafaxine (Effexor) with initial low (37.5 mg) dose for 3 days, then increase to 75 mg dose. - Schedule follow-up video visit in 4 weeks to assess medication response. - Emphasize consistency in medication timing.

## 2023-12-27 LAB — LIPID PANEL
Chol/HDL Ratio: 3 ratio (ref 0.0–4.4)
Cholesterol, Total: 167 mg/dL (ref 100–199)
HDL: 56 mg/dL (ref >39)
LDL Chol Calc (NIH): 102 mg/dL — ABNORMAL HIGH (ref 0–99)
Triglycerides: 40 mg/dL (ref 0–149)
VLDL Cholesterol Cal: 9 mg/dL (ref 5–40)

## 2023-12-27 LAB — COMPREHENSIVE METABOLIC PANEL WITH GFR
ALT: 22 IU/L (ref 0–32)
AST: 17 IU/L (ref 0–40)
Albumin: 4.6 g/dL (ref 4.0–5.0)
Alkaline Phosphatase: 93 IU/L (ref 44–121)
BUN/Creatinine Ratio: 11 (ref 9–23)
BUN: 10 mg/dL (ref 6–20)
Bilirubin Total: 0.2 mg/dL (ref 0.0–1.2)
CO2: 21 mmol/L (ref 20–29)
Calcium: 9.7 mg/dL (ref 8.7–10.2)
Chloride: 104 mmol/L (ref 96–106)
Creatinine, Ser: 0.87 mg/dL (ref 0.57–1.00)
Globulin, Total: 2.6 g/dL (ref 1.5–4.5)
Glucose: 90 mg/dL (ref 70–99)
Potassium: 4.4 mmol/L (ref 3.5–5.2)
Sodium: 139 mmol/L (ref 134–144)
Total Protein: 7.2 g/dL (ref 6.0–8.5)
eGFR: 92 mL/min/{1.73_m2} (ref >59)

## 2023-12-27 LAB — CBC
Hematocrit: 40.8 % (ref 34.0–46.6)
Hemoglobin: 13.3 g/dL (ref 11.1–15.9)
MCH: 29.8 pg (ref 26.6–33.0)
MCHC: 32.6 g/dL (ref 31.5–35.7)
MCV: 92 fL (ref 79–97)
Platelets: 267 10*3/uL (ref 150–450)
RBC: 4.46 x10E6/uL (ref 3.77–5.28)
RDW: 12.4 % (ref 11.7–15.4)
WBC: 7.4 10*3/uL (ref 3.4–10.8)

## 2023-12-27 LAB — HEMOGLOBIN A1C
Est. average glucose Bld gHb Est-mCnc: 105 mg/dL
Hgb A1c MFr Bld: 5.3 % (ref 4.8–5.6)

## 2024-01-01 ENCOUNTER — Encounter: Payer: Self-pay | Admitting: Family Medicine

## 2024-01-01 NOTE — Telephone Encounter (Signed)
    12/16/2023   10:25 AM 12/10/2022   10:08 AM 09/06/2022    9:29 AM 07/02/2022    1:16 PM 09/11/2021    3:31 PM  Depression screen PHQ 2/9  Decreased Interest 1 0 0 1 1  Down, Depressed, Hopeless 0 0 0 1 0  PHQ - 2 Score 1 0 0 2 1  Altered sleeping 1 0 0  0  Tired, decreased energy 1 0 1  1  Change in appetite 1 0 1  1  Feeling bad or failure about yourself  0 0 0  0  Trouble concentrating 1 0 0  1  Moving slowly or fidgety/restless 1 0 1  0  Suicidal thoughts 0 0 0  0  PHQ-9 Score 6 0 3  4  Difficult doing work/chores Somewhat difficult Not difficult at all Not difficult at all  Somewhat difficult      12/16/2023   10:26 AM 12/10/2022   10:08 AM 09/06/2022    9:30 AM 09/11/2021    3:32 PM  GAD 7 : Generalized Anxiety Score  Nervous, Anxious, on Edge 1 0 1 0  Control/stop worrying 1 0 1 0  Worry too much - different things 1 0 1 0  Trouble relaxing 1 0 1 0  Restless 0 0 0 0  Easily annoyed or irritable 1 0 1 1  Afraid - awful might happen 1 0 1 1  Total GAD 7 Score 6 0 6 2  Anxiety Difficulty Not difficult at all Not difficult at all Not difficult at all Somewhat difficult

## 2024-01-01 NOTE — Telephone Encounter (Signed)
 Please review and advise patient.   JM

## 2024-01-02 ENCOUNTER — Ambulatory Visit

## 2024-01-06 ENCOUNTER — Other Ambulatory Visit (HOSPITAL_COMMUNITY)
Admission: RE | Admit: 2024-01-06 | Discharge: 2024-01-06 | Disposition: A | Discharge status: Home / Self Care | Source: Ambulatory Visit | Attending: Obstetrics & Gynecology | Admitting: Obstetrics & Gynecology

## 2024-01-06 ENCOUNTER — Ambulatory Visit: Admitting: Obstetrics & Gynecology

## 2024-01-06 VITALS — BP 132/77 | HR 79 | Wt 184.7 lb

## 2024-01-06 DIAGNOSIS — R87612 Low grade squamous intraepithelial lesion on cytologic smear of cervix (LGSIL): Secondary | ICD-10-CM | POA: Insufficient documentation

## 2024-01-06 DIAGNOSIS — Z3202 Encounter for pregnancy test, result negative: Secondary | ICD-10-CM

## 2024-01-06 DIAGNOSIS — Z01812 Encounter for preprocedural laboratory examination: Secondary | ICD-10-CM

## 2024-01-06 DIAGNOSIS — Z3042 Encounter for surveillance of injectable contraceptive: Secondary | ICD-10-CM

## 2024-01-06 DIAGNOSIS — N888 Other specified noninflammatory disorders of cervix uteri: Secondary | ICD-10-CM

## 2024-01-06 LAB — POCT URINE PREGNANCY: Preg Test, Ur: NEGATIVE

## 2024-01-06 MED ORDER — MEDROXYPROGESTERONE ACETATE 150 MG/ML IM SUSP
150.0000 mg | Freq: Once | INTRAMUSCULAR | Status: AC
  Administered 2024-01-06: 150 mg via INTRAMUSCULAR

## 2024-01-06 NOTE — Progress Notes (Signed)
    GYNECOLOGY PROGRESS NOTE  Subjective:    Patient ID: Katrina Ortega, female    DOB: 1996/03/20, 28 y.o.   MRN: [id number]  HPI  Patient is a 28 y.o. G54P1001 (62 yo son) who presents for a colpo due to LGSIL pap 11/07/2023. She started with LGSIL pap in 2020 and had ASCUS with positive HR HPV last year. She had a colpo last year with a normal ECC.  She uses depo provera  for contraception and would like to get her shot today (due now). She has been monogamous for a year or so. She does not use tobacco products and reports that she got Gardasil series in the past.  The following portions of the patient's history were reviewed and updated as appropriate: allergies, current medications, past family history, past medical history, past social history, past surgical history, and problem list.  Review of Systems Pertinent items are noted in HPI.   Objective:   There were no vitals taken for this visit. There is no height or weight on file to calculate BMI.  Well nourished, well hydrated Black female, no apparent distress She is ambulating and conversing normally.  UPT negative, consent signed, time out done Speculum placed. Nulliparous cervix prepped with acetic acid. Transformation zone seen in its entirety. Colpo adequate. Colposcopic findings normal. ECC obtained. She tolerated the procedure well.   Assessment:   1. LGSIL on Pap smear of cervix      Plan:   1. LGSIL on Pap smear of cervix (Primary) - await pathology - - We discussed that if ECC is negative, then she will need a repeat pap in a year. If abnormal, then I will recommend a LEEP.

## 2024-01-07 ENCOUNTER — Ambulatory Visit

## 2024-01-07 DIAGNOSIS — Z3042 Encounter for surveillance of injectable contraceptive: Secondary | ICD-10-CM

## 2024-01-07 NOTE — Progress Notes (Deleted)
    NURSE VISIT NOTE  Subjective:    Patient ID: Katrina Ortega, female    DOB: 1995-11-16, 28 y.o.   MRN: [id number]  HPI  Patient is a 28 y.o. G45P1001 female who presents for depo provera  injection.   Objective:    There were no vitals taken for this visit.  Last Annual: 11/07/2023. Last pap: 11/07/2023. Last Depo-Provera : 10/17/2023. Side Effects if any: none***. Serum HCG indicated? No . Depo-Provera  150 mg IM given by: Venetta Gill, CMA. Site: Right Deltoid  Lab Review  No results found for any visits on 01/07/24.  Assessment:   1. Encounter for Depo-Provera  contraception      Plan:   Next appointment due between 8/27 and 04/07/2024.    Inga Manges, CMA

## 2024-01-08 LAB — SURGICAL PATHOLOGY

## 2024-01-09 ENCOUNTER — Encounter: Payer: Self-pay | Admitting: Obstetrics & Gynecology

## 2024-01-16 ENCOUNTER — Telehealth (INDEPENDENT_AMBULATORY_CARE_PROVIDER_SITE_OTHER): Admitting: Family Medicine

## 2024-01-16 ENCOUNTER — Encounter: Payer: Self-pay | Admitting: Family Medicine

## 2024-01-16 DIAGNOSIS — F32A Depression, unspecified: Secondary | ICD-10-CM | POA: Diagnosis not present

## 2024-01-16 DIAGNOSIS — F419 Anxiety disorder, unspecified: Secondary | ICD-10-CM

## 2024-01-16 MED ORDER — VENLAFAXINE HCL ER 75 MG PO CP24
75.0000 mg | ORAL_CAPSULE | Freq: Every day | ORAL | 1 refills | Status: DC
Start: 2024-01-16 — End: 2024-06-14

## 2024-01-16 MED ORDER — TRAZODONE HCL 50 MG PO TABS: 25.0000 mg | ORAL_TABLET | Freq: Every evening | ORAL | 1 refills | Status: AC | PRN
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
## 2024-01-25 NOTE — Assessment & Plan Note (Addendum)
 She has been taking Effexor  since May 20th, starting with a low dose and transitioning to a normal dose once daily. She experiences side effects such as visual disturbances, frequent yawning, and tongue spasms, particularly during yawning. Despite these, she notes some improvement in concentration and mood control.  Anxiety and depression Ongoing anxiety and depression with partial response to Effexor . PHQ-9 and GAD-7 scores remain elevated, possibly influenced by stress or recent changes. No suicidal ideation noted. - Continue Effexor  as prescribed. - Take with breakfast daily; consider switching to evening dosing if side effects persist. - Report any worsening symptoms or concerning side effects. - Follow up to monitor response and adjust treatment as needed.  Insomnia Insomnia with difficulty falling asleep and irregular sleep schedule. Trazodone  prescribed to aid sleep. - Prescribe trazodone  50 mg, 30 minutes before bedtime. - Start with 25 mg and adjust dose to avoid morning grogginess. - Encourage establishing a consistent sleep schedule.

## 2024-01-25 NOTE — Patient Instructions (Signed)
 Here is the refined patient action plan based on the provided details:  Patient Action Plan  Anxiety and Depression: - Continue taking Effexor  as prescribed. - Take with breakfast daily; switch to evening if side effects continue. - Report any worsening symptoms or concerning side effects. - Attend follow-up appointments to monitor and adjust treatment.  Insomnia: - Take trazodone  50 mg, 30 minutes before bedtime. - Start with 25 mg to avoid morning grogginess, then adjust as needed. - Establish a consistent sleep schedule.  Red Flags: - If you experience any new or worsening symptoms, contact your healthcare provider immediately.

## 2024-02-13 ENCOUNTER — Other Ambulatory Visit: Payer: Self-pay

## 2024-02-13 ENCOUNTER — Encounter: Payer: Self-pay | Admitting: Family Medicine

## 2024-02-13 DIAGNOSIS — Z0000 Encounter for general adult medical examination without abnormal findings: Secondary | ICD-10-CM

## 2024-03-12 DIAGNOSIS — Z0000 Encounter for general adult medical examination without abnormal findings: Secondary | ICD-10-CM | POA: Diagnosis not present

## 2024-03-15 LAB — QUANTIFERON-TB GOLD PLUS
QuantiFERON Mitogen Value: 0.93 [IU]/mL
QuantiFERON Nil Value: 0.01 [IU]/mL
QuantiFERON TB1 Ag Value: 0.01 [IU]/mL
QuantiFERON TB2 Ag Value: 0.01 [IU]/mL
QuantiFERON-TB Gold Plus: NEGATIVE

## 2024-03-30 ENCOUNTER — Ambulatory Visit

## 2024-03-30 NOTE — Patient Instructions (Incomplete)

## 2024-03-30 NOTE — Progress Notes (Deleted)
    NURSE VISIT NOTE  Subjective:    Patient ID: Katrina Ortega, female    DOB: 26-Jul-1996, 28 y.o.   MRN: [id number]  HPI  Patient is a 28 y.o. G1P1001 female who presents for depo provera  injection.   Objective:    There were no vitals taken for this visit.  Last Annual: 11/07/23. Last pap: 11/07/23. Last Depo-Provera : 01/06/24. Side Effects if any: none. Serum HCG indicated? No . Depo-Provera  150 mg IM given by: {AOB Clinical Dujqq:[id number]}. Site: Right Deltoid  Lab Review  No results found for any visits on 03/30/24.  Assessment:   No diagnosis found.   Plan:   Next appointment due between *** and ***.    Camelia Bars, LPN

## 2024-04-01 ENCOUNTER — Ambulatory Visit (INDEPENDENT_AMBULATORY_CARE_PROVIDER_SITE_OTHER)

## 2024-04-01 VITALS — BP 112/77 | HR 91 | Resp 16 | Ht 67.0 in | Wt 190.3 lb

## 2024-04-01 DIAGNOSIS — Z3042 Encounter for surveillance of injectable contraceptive: Secondary | ICD-10-CM | POA: Diagnosis not present

## 2024-04-01 MED ORDER — MEDROXYPROGESTERONE ACETATE 150 MG/ML IM SUSP: 150.0000 mg | INTRAMUSCULAR | 0 refills | Status: DC

## 2024-04-01 MED ORDER — MEDROXYPROGESTERONE ACETATE 150 MG/ML IM SUSP
150.0000 mg | Freq: Once | INTRAMUSCULAR | Status: AC
  Administered 2024-04-01: 150 mg via INTRAMUSCULAR

## 2024-04-01 NOTE — Addendum Note (Signed)
 Addended by: KIZZIE CAMELIA CROME on: 04/01/2024 02:48 PM   Modules accepted: Orders

## 2024-04-01 NOTE — Patient Instructions (Signed)

## 2024-04-01 NOTE — Progress Notes (Signed)
    NURSE VISIT NOTE  Subjective:    Patient ID: Katrina Ortega, female    DOB: Feb 22, 1996, 28 y.o.   MRN: [id number]  HPI  Patient is a 28 y.o. G43P1001 female who presents for depo provera  injection.   Objective:    BP 112/77   Pulse 91   Resp 16   Ht 5' 7 (1.702 m)   Wt 190 lb 4.8 oz (86.3 kg)   BMI 29.81 kg/m   Last Annual: 11/07/2023. Last pap: 11/07/2023. Last Depo-Provera : 01/06/2024. Side Effects if any: none. Serum HCG indicated? No . Depo-Provera  150 mg IM given by: Camelia Fetters, CMA. Site: Left Deltoid  Lab Review  No results found for any visits on 04/01/24.  Assessment:   1. Encounter for Depo-Provera  contraception      Plan:   Next appointment due between Nov. 20  and Dec. 4   Camelia Fetters, CMA [facility]

## 2024-04-13 ENCOUNTER — Telehealth: Payer: Self-pay | Admitting: Family Medicine

## 2024-04-13 ENCOUNTER — Telehealth: Payer: Self-pay | Admitting: Gastroenterology

## 2024-04-13 DIAGNOSIS — J3089 Other allergic rhinitis: Secondary | ICD-10-CM | POA: Diagnosis not present

## 2024-04-13 DIAGNOSIS — Z3202 Encounter for pregnancy test, result negative: Secondary | ICD-10-CM | POA: Diagnosis not present

## 2024-04-13 DIAGNOSIS — R82998 Other abnormal findings in urine: Secondary | ICD-10-CM | POA: Diagnosis not present

## 2024-04-13 DIAGNOSIS — B379 Candidiasis, unspecified: Secondary | ICD-10-CM | POA: Diagnosis not present

## 2024-04-13 DIAGNOSIS — Z113 Encounter for screening for infections with a predominantly sexual mode of transmission: Secondary | ICD-10-CM | POA: Diagnosis not present

## 2024-04-13 DIAGNOSIS — J029 Acute pharyngitis, unspecified: Secondary | ICD-10-CM | POA: Diagnosis not present

## 2024-04-13 DIAGNOSIS — T3695XA Adverse effect of unspecified systemic antibiotic, initial encounter: Secondary | ICD-10-CM | POA: Diagnosis not present

## 2024-04-13 DIAGNOSIS — B9689 Other specified bacterial agents as the cause of diseases classified elsewhere: Secondary | ICD-10-CM | POA: Diagnosis not present

## 2024-04-13 DIAGNOSIS — N760 Acute vaginitis: Secondary | ICD-10-CM | POA: Diagnosis not present

## 2024-04-13 DIAGNOSIS — N898 Other specified noninflammatory disorders of vagina: Secondary | ICD-10-CM | POA: Diagnosis not present

## 2024-04-13 NOTE — Telephone Encounter (Signed)
 Patient called in and wanted Dr. Alvia to know that her ADHD medication has too many side effects and she will not be taking it anymore. Pt is also inquiring about the MRI referral that was placed. Patient stated that no one ever called her. Patient is also wanting a referral to go to [facility] due to her tonsil stones, patient stated that they are getting worse to the point that she can taste them on her breath and is having to rinse a lot. Patient would like a call back for an update on these matters

## 2024-04-13 NOTE — Telephone Encounter (Signed)
 Good Morning Dr. Shila,   Patient states that we have received a referral to be seen for issues with reflux. She states that she has bloating and alternating bowel habits. She states that she was previously seen with [facility] and states she would like to transfer due to feeling like they are unorganized. Patient was given a list of our providers and is requesting to transfer to you. Patients records are in epic, will you please review and advise on scheduling? Thank you.

## 2024-04-13 NOTE — Telephone Encounter (Signed)
 Please review. JM

## 2024-04-14 NOTE — Telephone Encounter (Signed)
 Ok, please schedule next available appointment with APP.  Thanks

## 2024-04-15 ENCOUNTER — Encounter: Payer: Self-pay | Admitting: Gastroenterology

## 2024-04-15 NOTE — Telephone Encounter (Signed)
 Patient has been scheduled with Dr. Shila on 10/22 at 1:50

## 2024-04-26 ENCOUNTER — Other Ambulatory Visit: Payer: Self-pay

## 2024-04-26 ENCOUNTER — Encounter: Payer: Self-pay | Admitting: Family Medicine

## 2024-05-19 ENCOUNTER — Ambulatory Visit: Admitting: Gastroenterology

## 2024-05-20 ENCOUNTER — Ambulatory Visit: Admitting: Gastroenterology

## 2024-06-09 ENCOUNTER — Encounter: Payer: Self-pay | Admitting: Obstetrics and Gynecology

## 2024-06-09 ENCOUNTER — Encounter: Payer: Self-pay | Admitting: Family Medicine

## 2024-06-10 ENCOUNTER — Telehealth: Payer: Self-pay | Admitting: Obstetrics and Gynecology

## 2024-06-10 NOTE — Telephone Encounter (Signed)
 MRI was denied.  JM

## 2024-06-10 NOTE — Telephone Encounter (Signed)
 Reached out to pt to schedule an appt with [facility] for pelvic pain, breast exam, and UTI sx.  Left message for pt to call back to schedule.  Sent a MyChart message as well.

## 2024-06-10 NOTE — Telephone Encounter (Signed)
 I sent message to [facility] as well.  JM

## 2024-06-10 NOTE — Telephone Encounter (Signed)
 Please review patient message and advise.   JM
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
## 2024-06-14 ENCOUNTER — Encounter: Payer: Self-pay | Admitting: Gastroenterology

## 2024-06-14 ENCOUNTER — Ambulatory Visit: Admitting: Obstetrics and Gynecology

## 2024-06-14 ENCOUNTER — Encounter: Payer: Self-pay | Admitting: Obstetrics and Gynecology

## 2024-06-14 VITALS — BP 101/63 | HR 86 | Ht 67.0 in | Wt 186.0 lb

## 2024-06-14 DIAGNOSIS — R399 Unspecified symptoms and signs involving the genitourinary system: Secondary | ICD-10-CM

## 2024-06-14 DIAGNOSIS — Z3009 Encounter for other general counseling and advice on contraception: Secondary | ICD-10-CM | POA: Diagnosis not present

## 2024-06-14 DIAGNOSIS — N946 Dysmenorrhea, unspecified: Secondary | ICD-10-CM | POA: Diagnosis not present

## 2024-06-14 DIAGNOSIS — N898 Other specified noninflammatory disorders of vagina: Secondary | ICD-10-CM | POA: Diagnosis not present

## 2024-06-14 DIAGNOSIS — N644 Mastodynia: Secondary | ICD-10-CM | POA: Diagnosis not present

## 2024-06-14 DIAGNOSIS — N921 Excessive and frequent menstruation with irregular cycle: Secondary | ICD-10-CM | POA: Diagnosis not present

## 2024-06-14 DIAGNOSIS — Z1329 Encounter for screening for other suspected endocrine disorder: Secondary | ICD-10-CM

## 2024-06-14 DIAGNOSIS — Z113 Encounter for screening for infections with a predominantly sexual mode of transmission: Secondary | ICD-10-CM | POA: Diagnosis not present

## 2024-06-14 DIAGNOSIS — E049 Nontoxic goiter, unspecified: Secondary | ICD-10-CM

## 2024-06-14 DIAGNOSIS — R102 Pelvic and perineal pain unspecified side: Secondary | ICD-10-CM

## 2024-06-14 DIAGNOSIS — Z3042 Encounter for surveillance of injectable contraceptive: Secondary | ICD-10-CM | POA: Diagnosis not present

## 2024-06-14 DIAGNOSIS — R350 Frequency of micturition: Secondary | ICD-10-CM | POA: Diagnosis not present

## 2024-06-14 LAB — POCT URINALYSIS DIPSTICK
Bilirubin, UA: NEGATIVE
Glucose, UA: NEGATIVE
Ketones, UA: NEGATIVE
Leukocytes, UA: NEGATIVE
Nitrite, UA: NEGATIVE
Protein, UA: NEGATIVE
Spec Grav, UA: 1.015 (ref 1.010–1.025)
pH, UA: 6 (ref 5.0–8.0)

## 2024-06-14 MED ORDER — MEDROXYPROGESTERONE ACETATE 150 MG/ML IM SUSY
150.0000 mg | PREFILLED_SYRINGE | Freq: Once | INTRAMUSCULAR | Status: AC
  Administered 2024-06-14: 150 mg via INTRAMUSCULAR

## 2024-06-14 NOTE — Addendum Note (Signed)
 Addended by: Donielle Radziewicz on: 06/14/2024 11:59 AM   Modules accepted: Orders

## 2024-06-14 NOTE — Patient Instructions (Signed)
 I value your feedback and you entrusting [place] with your care. If you get a [place] patient survey, I would appreciate you taking the time to let us know about your experience today. Thank you! ? ? ?

## 2024-06-15 ENCOUNTER — Ambulatory Visit: Admitting: Family Medicine

## 2024-06-15 ENCOUNTER — Encounter: Payer: Self-pay | Admitting: Family Medicine

## 2024-06-15 ENCOUNTER — Ambulatory Visit: Payer: Self-pay | Admitting: Obstetrics and Gynecology

## 2024-06-15 VITALS — BP 100/58 | HR 85 | Ht 67.0 in | Wt 184.0 lb

## 2024-06-15 DIAGNOSIS — M4727 Other spondylosis with radiculopathy, lumbosacral region: Secondary | ICD-10-CM | POA: Diagnosis not present

## 2024-06-15 DIAGNOSIS — F419 Anxiety disorder, unspecified: Secondary | ICD-10-CM | POA: Diagnosis not present

## 2024-06-15 DIAGNOSIS — F32A Depression, unspecified: Secondary | ICD-10-CM

## 2024-06-15 LAB — TSH+FREE T4
Free T4: 1.27 ng/dL (ref 0.82–1.77)
TSH: 1.68 u[IU]/mL (ref 0.450–4.500)

## 2024-06-15 MED ORDER — CARIPRAZINE HCL 3 MG PO CAPS: 3.0000 mg | ORAL_CAPSULE | Freq: Every day | ORAL | 0 refills | Status: DC

## 2024-06-15 NOTE — Progress Notes (Signed)
 Primary Care / Sports Medicine Office Visit  Patient Information:  Patient ID: Katrina Ortega, female DOB: 08/29/1995 Age: 28 y.o. MRN: [id number]   Katrina Ortega is a pleasant 28 y.o. female presenting with the following:  Chief Complaint  Patient presents with   medication follow up    Patient was taking Effexor  but once she upped the dose she began to have blurred vision, vomiting, insomnia, and moody. She has been off for a couple months now but is wanting to try something new.     Vitals:   06/15/24 1342  BP: (!) 100/58  Pulse: 85  SpO2: 100%   Vitals:   06/15/24 1342  Weight: 184 lb (83.5 kg)  Height: 5' 7 (1.702 m)   Body mass index is 28.82 kg/m.  No results found.   Discussed the use of AI scribe software for clinical note transcription with the patient, who gave verbal consent to proceed.   Independent interpretation of notes and tests performed by another provider:   None  Procedures performed:   None  Pertinent History, Exam, Impression, and Recommendations:   Problem List Items Addressed This Visit     Anxiety and depression   History of Present Illness Katrina Ortega is a 28 year old female who presents with stress and concentration issues.  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 Discussed Vraylar for mood stabilization and anxiety/depression. Explained potential side effects and importance of treatment. - Initiated Vraylar 1.5 mg daily for two weeks, then increase to 3 mg daily. - Scheduled follow-up video visit in one month to assess response and side effects. - Referred to psychiatry for further evaluation and management.      Relevant Medications   cariprazine (VRAYLAR) 3 MG capsule   Lumbosacral spondylosis with radiculopathy - Primary   Lower extremity neurological and vascular symptoms - Leg numbness intermittently - History of physical therapy in 2022 for lower extremity symptoms  Plan Chronic low back and leg pain with numbness Discussed potential pinched nerve and need for further evaluation. - Ordered MRI to evaluate for potential pinched nerve or other underlying causes. - If MRI not approved, refer to physical therapy for further management.      Relevant Medications   cariprazine (VRAYLAR) 3 MG capsule   Other Relevant Orders   MR Lumbar Spine Wo Contrast     Orders & Medications Medications:  Meds ordered this encounter  Medications   cariprazine (VRAYLAR) 3 MG capsule    Sig: Take 1 capsule (  3 mg total) by mouth daily.    Dispense:  90 capsule    Refill:  0   Orders Placed This Encounter  Procedures   MR Lumbar Spine Wo Contrast     Return in about 4 weeks (around 07/13/2024) for MyChart Video Visit.     Selinda JINNY Ku, MD, [facility]   Primary Care Sports  Medicine Primary Care and Sports Medicine at [facility]

## 2024-06-15 NOTE — Addendum Note (Signed)
 Addended by: WATT HILA B on: 06/15/2024 08:22 AM   Modules accepted: Orders

## 2024-06-15 NOTE — Assessment & Plan Note (Signed)
 Lower extremity neurological and vascular symptoms - Leg numbness intermittently - History of physical therapy in 2022 for lower extremity symptoms  Plan  Chronic low back and leg pain with numbness Discussed potential pinched nerve and need for further evaluation. - Ordered MRI to evaluate for potential pinched nerve or other underlying causes. - If MRI not approved, refer to physical therapy for further management.

## 2024-06-15 NOTE — Assessment & Plan Note (Signed)
 History of Present Illness Katrina Ortega is a 28 year old female who presents with stress and concentration issues.  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 Discussed Vraylar for mood stabilization and anxiety/depression. Explained potential side effects and importance of treatment. - Initiated Vraylar 1.5 mg daily for two weeks, then increase to 3 mg daily. - Scheduled follow-up video visit in one month to assess response and side effects. - Referred to psychiatry for further evaluation and management.

## 2024-06-15 NOTE — Patient Instructions (Addendum)
 VISIT SUMMARY:  Today, we discussed your ongoing stress and concentration issues, adverse effects from previous medication, and chronic leg pain. We have started a new medication and planned further evaluations to address your symptoms.  YOUR PLAN:  DEPRESSION AND MOOD SYMPTOMS: You have been experiencing mood disturbances and concentration issues, with a positive screen. -Start taking Vraylar 1.5 mg daily for two weeks, then increase to 3 mg daily. -We will have a follow-up video visit in one month to assess your response and any side effects. -You are referred to psychiatry for further evaluation and management.  CHRONIC LOW BACK AND LEG PAIN WITH NUMBNESS: You have chronic pain and numbness in your legs, possibly due to a pinched nerve. -An MRI has been ordered to evaluate for a potential pinched nerve or other underlying causes. -If the MRI is not approved, you will be referred to physical therapy for further management.

## 2024-06-16 LAB — NUSWAB VAGINITIS PLUS (VG+)
Atopobium vaginae: HIGH {score} — AB
BVAB 2: HIGH {score} — AB
Candida albicans, NAA: NEGATIVE
Candida glabrata, NAA: NEGATIVE
Chlamydia trachomatis, NAA: NEGATIVE
Megasphaera 1: HIGH {score} — AB
Neisseria gonorrhoeae, NAA: NEGATIVE
Trich vag by NAA: NEGATIVE

## 2024-06-16 LAB — URINE CULTURE

## 2024-06-16 MED ORDER — CLINDAMYCIN HCL 300 MG PO CAPS: 300.0000 mg | ORAL_CAPSULE | Freq: Two times a day (BID) | ORAL | 0 refills | Status: AC

## 2024-06-16 NOTE — Addendum Note (Signed)
 Addended by: WATT HILA B on: 06/16/2024 09:16 AM   Modules accepted: Orders

## 2024-06-18 ENCOUNTER — Ambulatory Visit

## 2024-06-18 ENCOUNTER — Other Ambulatory Visit (HOSPITAL_COMMUNITY): Payer: Self-pay

## 2024-06-18 ENCOUNTER — Telehealth: Payer: Self-pay

## 2024-06-18 NOTE — Telephone Encounter (Signed)
 Pharmacy Patient Advocate Encounter   Received notification from Onbase that prior authorization for Vraylar  3mg  capsules is required/requested.   Insurance verification completed.   The patient is insured through [employer].   Per test claim: PA required and submitted KEY/EOC/Request #: [id number] from 06/18/2024 to 06/18/2025

## 2024-06-21 ENCOUNTER — Ambulatory Visit
Admission: RE | Admit: 2024-06-21 | Discharge: 2024-06-21 | Disposition: A | Discharge status: Home / Self Care | Source: Ambulatory Visit | Attending: Obstetrics and Gynecology | Admitting: Obstetrics and Gynecology

## 2024-06-21 DIAGNOSIS — E049 Nontoxic goiter, unspecified: Secondary | ICD-10-CM | POA: Diagnosis not present

## 2024-06-21 DIAGNOSIS — R221 Localized swelling, mass and lump, neck: Secondary | ICD-10-CM | POA: Diagnosis not present

## 2024-06-21 DIAGNOSIS — E010 Iodine-deficiency related diffuse (endemic) goiter: Secondary | ICD-10-CM | POA: Diagnosis not present

## 2024-06-28 ENCOUNTER — Ambulatory Visit
Admission: RE | Admit: 2024-06-28 | Discharge: 2024-06-28 | Disposition: A | Source: Ambulatory Visit | Attending: Family Medicine

## 2024-06-28 DIAGNOSIS — M4727 Other spondylosis with radiculopathy, lumbosacral region: Secondary | ICD-10-CM | POA: Insufficient documentation

## 2024-06-28 DIAGNOSIS — M5116 Intervertebral disc disorders with radiculopathy, lumbar region: Secondary | ICD-10-CM | POA: Diagnosis not present

## 2024-06-28 DIAGNOSIS — M5117 Intervertebral disc disorders with radiculopathy, lumbosacral region: Secondary | ICD-10-CM | POA: Diagnosis not present

## 2024-06-28 DIAGNOSIS — M48061 Spinal stenosis, lumbar region without neurogenic claudication: Secondary | ICD-10-CM | POA: Diagnosis not present

## 2024-06-29 ENCOUNTER — Encounter: Payer: Self-pay | Admitting: Obstetrics and Gynecology

## 2024-06-29 DIAGNOSIS — N898 Other specified noninflammatory disorders of vagina: Secondary | ICD-10-CM

## 2024-06-29 MED ORDER — FLUCONAZOLE 150 MG PO TABS: 150.0000 mg | ORAL_TABLET | Freq: Once | ORAL | 0 refills | Status: AC

## 2024-07-06 ENCOUNTER — Ambulatory Visit: Payer: Self-pay | Admitting: Family Medicine

## 2024-07-08 ENCOUNTER — Ambulatory Visit: Admitting: Gastroenterology

## 2024-07-09 NOTE — Telephone Encounter (Signed)
 Pt was seen on 06/14/2024 with [facility].

## 2024-07-13 ENCOUNTER — Ambulatory Visit: Admitting: Family Medicine

## 2024-07-27 ENCOUNTER — Encounter: Payer: Self-pay | Admitting: Family Medicine

## 2024-07-27 ENCOUNTER — Telehealth (INDEPENDENT_AMBULATORY_CARE_PROVIDER_SITE_OTHER): Admitting: Family Medicine

## 2024-07-27 DIAGNOSIS — M4727 Other spondylosis with radiculopathy, lumbosacral region: Secondary | ICD-10-CM

## 2024-07-27 MED ORDER — MELOXICAM 15 MG PO TABS: 15.0000 mg | ORAL_TABLET | Freq: Every day | ORAL | 0 refills | Status: DC | PRN

## 2024-07-27 MED ORDER — TIZANIDINE HCL 4 MG PO TABS: 4.0000 mg | ORAL_TABLET | Freq: Three times a day (TID) | ORAL | 1 refills | Status: AC | PRN

## 2024-07-27 MED ORDER — GABAPENTIN 100 MG PO CAPS: 100.0000 mg | ORAL_CAPSULE | Freq: Every evening | ORAL | 1 refills | Status: AC | PRN
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
## 2024-07-27 NOTE — Patient Instructions (Signed)
 VISIT SUMMARY:  During your visit, we discussed your worsening low back pain and left lower extremity numbness, as well as your mood disorder. We reviewed your MRI results and adjusted your medications to better manage your symptoms. We also talked about your concerns regarding starting Vraylar  for your mood disorder.  YOUR PLAN:  LUMBOSACRAL SPONDYLOSIS WITH RADICULOPATHY: You have chronic low back pain with symptoms radiating to your left leg, which have recently worsened. Your MRI shows mild degenerative changes and narrowing at certain levels of your spine. -Start taking meloxicam  15 mg daily as needed for pain, with food. -Start taking gabapentin  at a low dose nightly as needed for nerve pain. Monitor for any sedation. -Refilled tizanidine for muscle spasms. Try it at home to see how well you tolerate it. -You have been referred to a spine specialist for further evaluation and possible epidural steroid injections. -Hold off on formal physical therapy until after seeing the specialist. -We will provide a home exercise program for core strengthening once your symptoms are better controlled. -We will have a follow-up video visit in six weeks to reassess your symptoms.  MOOD DISORDER: You have ongoing mood swings and increased stress. You have not started Vraylar  due to concerns about taking multiple medications. -Start Vraylar  at 1.5 mg daily for two weeks, then increase to 3 mg daily. -Use the provided strategies to help with medication adherence. -Your referral to psychiatry at [facility] is pending. -Consider therapy options, including telehealth. -We will have a follow-up video visit in six weeks to monitor your mood and how you are responding to the medication.

## 2024-08-06 ENCOUNTER — Ambulatory Visit
Admission: EM | Admit: 2024-08-06 | Discharge: 2024-08-06 | Disposition: A | Discharge status: Home / Self Care | Attending: Family Medicine | Admitting: Family Medicine

## 2024-08-06 ENCOUNTER — Encounter: Payer: Self-pay | Admitting: Emergency Medicine

## 2024-08-06 DIAGNOSIS — Z202 Contact with and (suspected) exposure to infections with a predominantly sexual mode of transmission: Secondary | ICD-10-CM | POA: Diagnosis present

## 2024-08-06 DIAGNOSIS — N898 Other specified noninflammatory disorders of vagina: Secondary | ICD-10-CM

## 2024-08-06 DIAGNOSIS — R1030 Lower abdominal pain, unspecified: Secondary | ICD-10-CM | POA: Insufficient documentation

## 2024-08-06 LAB — POCT URINE DIPSTICK
Bilirubin, UA: NEGATIVE
Glucose, UA: NEGATIVE mg/dL
Ketones, POC UA: NEGATIVE mg/dL
Leukocytes, UA: NEGATIVE
Nitrite, UA: NEGATIVE
POC PROTEIN,UA: NEGATIVE
Spec Grav, UA: 1.025
Urobilinogen, UA: 0.2 U/dL
pH, UA: 6.5

## 2024-08-06 LAB — POCT URINE PREGNANCY: Preg Test, Ur: NEGATIVE
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
## 2024-08-06 NOTE — Discharge Instructions (Addendum)
 You do not have a UTI.  You are not pregnant.  Your STD test results will be available in the next 72 hours. If positive, someone will contact you.  You should see your results in your MyChart account.

## 2024-08-06 NOTE — ED Triage Notes (Signed)
 Patient c/o dysuria, urinary frequency and lower abdominal and back pain for the past 2 weeks.  Patient reports some vaginal discharge.

## 2024-08-09 ENCOUNTER — Other Ambulatory Visit: Payer: Self-pay

## 2024-08-09 ENCOUNTER — Encounter: Payer: Self-pay | Admitting: Family Medicine

## 2024-08-09 DIAGNOSIS — F419 Anxiety disorder, unspecified: Secondary | ICD-10-CM

## 2024-08-09 LAB — CERVICOVAGINAL ANCILLARY ONLY
Bacterial Vaginitis (gardnerella): NEGATIVE
Candida Glabrata: NEGATIVE
Candida Vaginitis: NEGATIVE
Chlamydia: NEGATIVE
Comment: NEGATIVE
Comment: NEGATIVE
Comment: NEGATIVE
Comment: NEGATIVE
Comment: NEGATIVE
Comment: NORMAL
Neisseria Gonorrhea: NEGATIVE
Trichomonas: NEGATIVE

## 2024-08-09 MED ORDER — CARIPRAZINE HCL 3 MG PO CAPS: 3.0000 mg | ORAL_CAPSULE | Freq: Every day | ORAL | 0 refills | Status: AC

## 2024-08-11 ENCOUNTER — Other Ambulatory Visit (INDEPENDENT_AMBULATORY_CARE_PROVIDER_SITE_OTHER)

## 2024-08-11 ENCOUNTER — Ambulatory Visit: Admitting: Gastroenterology

## 2024-08-11 ENCOUNTER — Encounter: Payer: Self-pay | Admitting: Gastroenterology

## 2024-08-11 VITALS — BP 120/60 | HR 100 | Ht 67.0 in | Wt 185.4 lb

## 2024-08-11 DIAGNOSIS — K219 Gastro-esophageal reflux disease without esophagitis: Secondary | ICD-10-CM | POA: Diagnosis not present

## 2024-08-11 DIAGNOSIS — R1310 Dysphagia, unspecified: Secondary | ICD-10-CM

## 2024-08-11 DIAGNOSIS — A048 Other specified bacterial intestinal infections: Secondary | ICD-10-CM

## 2024-08-11 DIAGNOSIS — R1013 Epigastric pain: Secondary | ICD-10-CM

## 2024-08-11 DIAGNOSIS — K582 Mixed irritable bowel syndrome: Secondary | ICD-10-CM

## 2024-08-11 MED ORDER — PANTOPRAZOLE SODIUM 40 MG PO TBEC: 40.0000 mg | DELAYED_RELEASE_TABLET | Freq: Two times a day (BID) | ORAL | 3 refills | Status: AC

## 2024-08-11 NOTE — Progress Notes (Signed)
 "                Katrina Ortega    [id number]    1996/02/23  Primary Care Physician:Matthews, Selinda PARAS, MD  Referring Physician: Alvia Selinda PARAS, MD [address] [employer],  [place] [address]   Chief complaint:  GERD, epigastric pain, nausea, IBS constipation and diarrhea, abdominal bloating  Discussed the use of AI scribe software for clinical note transcription with the patient, who gave verbal consent to proceed.  History of Present Illness Katrina Ortega is a 29 year old female with chronic gastrointestinal symptoms and prior Helicobacter pylori infection who presents for evaluation of persistent bloating.  Gastrointestinal Symptoms: - Chronic acid reflux since childhood, with increased frequency and severity since 2021 following childbirth - Persistent abdominal bloating and distension, described as looked pregnant for two years - Frequent abdominal pain and cramping, occasionally with a lopsided sensation - Symptoms worsen with greasy or fried foods, sometimes resulting in vomiting oil the following morning - Dark sodas cause immediate bloating, which improves with water and juice - Early morning awakening due to abdominal pain - No relief with previous trials of omeprazole  and pantoprazole ; currently prefers antacid chews for symptom control  Altered Bowel Habits: - Fluctuations between constipation and diarrhea - One to two bowel movements daily, often with sensation of incomplete evacuation - Recent use of detox teas has resulted in softer stools but persistent bloating - Cramping and occasional sharp abdominal pain  Helicobacter pylori Infection: - Prior diagnosis of Helicobacter pylori infection - Underwent treatment but had difficulty completing the regimen  Medication Use: - Long-term NSAID use for back pain, including meloxicam  (currently taken less frequently), ibuprofen , and muscle relaxers for sleep and pain control - Uses ibuprofen  and antacid chews  before sleep for abdominal pain relief  Dietary Factors: - Diet includes many vegetables; previously craved fruit and now prefers fruit over juice - Has stopped drinking dark sodas due to bloating; avoids diet sodas and energy drinks - Drinks one to two cups of coffee per day  Procedural History: - Did not proceed with scheduled colonoscopy due to concerns about the procedure and communication issues    Barium esophagogram 03/21/23 Small volume gastroesophageal reflux. Otherwise normal esophagram.   Outpatient Encounter Medications as of 08/11/2024  Medication Sig   amitriptyline  (ELAVIL ) 25 MG tablet TAKE 1 TABLET(25 MG) BY MOUTH AT BEDTIME   azelastine (ASTELIN) 0.1 % nasal spray Place 1 spray into both nostrils 2 (two) times daily.   cariprazine  (VRAYLAR ) 3 MG capsule Take 1 capsule (3 mg total) by mouth daily.   cetirizine  (ZYRTEC  ALLERGY) 10 MG tablet Take 1 tablet (10 mg total) by mouth daily.   gabapentin  (NEURONTIN ) 100 MG capsule Take 1 capsule (100 mg total) by mouth at bedtime as needed.   hydrocortisone  (ANUSOL -HC) 2.5 % rectal cream Place 1 Application rectally 2 (two) times daily.   meloxicam  (MOBIC ) 15 MG tablet Take 1 tablet (15 mg total) by mouth daily as needed for pain.   omeprazole  (PRILOSEC) 40 MG capsule Take 1 capsule (40 mg total) by mouth 2 (two) times daily.   tiZANidine  (ZANAFLEX ) 4 MG tablet Take 1 tablet (4 mg total) by mouth every 8 (eight) hours as needed for muscle spasms.   traZODone  (DESYREL ) 50 MG tablet Take 0.5-2 tablets (25-100 mg total) by mouth at bedtime as needed.   No facility-administered encounter medications on file as of 08/11/2024.    Allergies as of 08/11/2024 -  Review Complete 08/11/2024  Allergen Reaction Noted   Diphenhydramine -acetaminophen   08/26/2023   Percocet [oxycodone -acetaminophen ] Itching 09/28/2016    Past Medical History:  Diagnosis Date   Allergy    Anemia    Anxiety and depression    GERD (gastroesophageal reflux  disease)    Group beta Strep positive 08/24/2018   Identified in urine 01/22 Will require prophylaxis in labor   Hypertension    preeclampsia   Medical history non-contributory    Mental disorder    Supervision of other normal pregnancy, antepartum 08/19/2018    Nursing Staff Provider Office Location  [place] [facility]   LMP Language   ENG Anatomy US   Normal Flu Vaccine  Declined Genetic Screen  Declined  TDaP vaccine   11/04/2018 Hgb A1C or  GTT Early  Third trimester  Rhogam   N/A: A POS   LAB RESULTS  Feeding Plan  breast Blood Type A/Positive/-- (01/22 1555)  Contraception  Depo Antibody Negative (01/22 1555) Circumcision Yes Rubella 3.68 (01/22 1555) Pediatri    Past Surgical History:  Procedure Laterality Date   CESAREAN SECTION N/A 01/22/2019   Procedure: CESAREAN SECTION;  Surgeon: Alger Gong, MD;  Location: [facility];  Service: Obstetrics;  Laterality: N/A;   TOENAIL EXCISION Bilateral 2021   WISDOM TOOTH EXTRACTION      Family History  Problem Relation Age of Onset   Multiple sclerosis Mother    Migraines Mother    Hypertension Father    Migraines Father    Other Father        tumor in front of brain   Diabetes Paternal Uncle    Schizophrenia Maternal Grandmother    Diabetes Maternal Grandfather    Diabetes Paternal Grandmother    Hypertension Paternal Grandmother    Eczema Son    Breast cancer Maternal Great-grandmother        mid to late years    Social History   Socioeconomic History   Marital status: Single    Spouse name: Not on file   Number of children: 1   Years of education: 12+   Highest education level: Some college, no degree  Occupational History   Not on file  Tobacco Use   Smoking status: Every Day    Types: Cigars   Smokeless tobacco: Never  Vaping Use   Vaping status: Every Day   Substances: Nicotine, THC, Flavoring  Substance and Sexual Activity   Alcohol use: Yes    Alcohol/week: 6.0 standard drinks of alcohol    Types: 6 Shots of  liquor per week    Comment: weekends socially   Drug use: Yes    Frequency: 2.0 times per week    Types: Marijuana   Sexual activity: Yes    Partners: Male    Birth control/protection: Injection  Other Topics Concern   Not on file  Social History Narrative   Not on file   Social Drivers of Health   Tobacco Use: High Risk (08/11/2024)   Patient History    Smoking Tobacco Use: Every Day    Smokeless Tobacco Use: Never    Passive Exposure: Not on file  Financial Resource Strain: Low Risk (09/06/2022)   Overall Financial Resource Strain (CARDIA)    Difficulty of Paying Living Expenses: Not hard at all  Food Insecurity: No Food Insecurity (09/06/2022)   Hunger Vital Sign    Worried About Running Out of Food in the Last Year: Never true    Ran Out of Food in the  Last Year: Never true  Transportation Needs: No Transportation Needs (09/06/2022)   PRAPARE - Administrator, Civil Service (Medical): No    Lack of Transportation (Non-Medical): No  Physical Activity: Not on file  Stress: Not on file  Social Connections: Not on file  Intimate Partner Violence: Not At Risk (09/06/2022)   Humiliation, Afraid, Rape, and Kick questionnaire    Fear of Current or Ex-Partner: No    Emotionally Abused: No    Physically Abused: No    Sexually Abused: No  Depression (PHQ2-9): Medium Risk (06/15/2024)   Depression (PHQ2-9)    PHQ-2 Score: 9  Alcohol Screen: Low Risk (09/11/2021)   Alcohol Screen    Last Alcohol Screening Score (AUDIT): 4  Housing: Low Risk (09/06/2022)   Housing    Last Housing Risk Score: 0  Utilities: Not At Risk (09/06/2022)   AHC Utilities    Threatened with loss of utilities: No  Health Literacy: Not on file      Review of systems: All other review of systems negative except as mentioned in the HPI.   Physical Exam: Vitals:   08/11/24 1402  BP: 120/60  Pulse: 100   Body mass index is 29.03 kg/m. Gen:      No acute distress HEENT:  sclera anicteric CV:  s1s2 rrr, no murmur Lungs: B/l clear. Abd:      soft, non-tender; no palpable masses, no distension Ext:    No edema Neuro: alert and oriented x 3 Psych: normal mood and affect  Data Reviewed:  Reviewed labs, radiology imaging, old records and pertinent past GI work up    Assessment & Plan Gastroesophageal reflux disease Chronic, moderate-to-severe symptoms triggered by diet and medications, exacerbated by NSAID use and late meals. Increased risk for ulceration due to NSAID exposure and possible Helicobacter pylori infection. - Prescribed pantoprazole  before breakfast and dinner. - Advised to avoid NSAIDs (ibuprofen , naproxen, meloxicam ); recommended acetaminophen  and muscle relaxants for pain management. - Recommended dietary modifications: avoid sodas, artificial sweeteners, fruit juices, and energy drinks; increase fruits and vegetables; prefer whole fruit over juice. - Advised to avoid late dinners and eat 3-4 hours before bedtime. - Advised to drink water with lemon and limit coffee to 1-2 cups daily. - Scheduled upper endoscopy to evaluate for gastritis, ulcers, and other pathology. - Provided anticipatory guidance on the impact of dietary sugars and late meals on reflux symptoms.  Helicobacter pylori infection, under evaluation Incomplete prior treatment with persistent gastrointestinal symptoms. Currently off acid suppressants to allow for accurate testing; re-evaluation warranted for ongoing infection. - Ordered stool antigen test for H. pylori. - Advised to remain off acid suppressants until stool test is completed. - Scheduled upper endoscopy to evaluate for gastritis or ulcers.  Irritable bowel syndrome Chronic, fluctuating bloating, abdominal discomfort, and alternating constipation and diarrhea. Partial improvement with dietary changes. No evidence of chronic diarrhea or colitis; colonoscopy deferred due to low likelihood of colitis and patient preference. - Prescribed  dicyclomine  as needed every 8 hours for cramping, bloating, and fullness. - Advised to avoid fiber supplements due to bloating. - Recommended docusate sodium  nightly to improve evacuation. - Ordered serologic testing for gluten sensitivity to evaluate dietary triggers. - Advised to increase water intake to 8 cups daily. - Recommended dietary modifications: avoid sodas, artificial sweeteners, fruit juices, and energy drinks; increase fruits and vegetables; prefer whole fruit over juice. - Deferred colonoscopy; discussed reconsideration if symptoms change.    The patient was provided an opportunity  to ask questions and all were answered. The patient agreed with the plan and demonstrated an understanding of the instructions.  LOIS Wilkie Mcgee , MD    CC: Alvia Selinda PARAS, MD    "

## 2024-08-11 NOTE — Patient Instructions (Addendum)
 VISIT SUMMARY:  Your provider has requested that you go to the basement level for lab work before leaving today. Press B on the elevator. The lab is located at the first door on the left as you exit the elevator.   During your visit, we discussed your chronic gastrointestinal symptoms, including acid reflux, bloating, and altered bowel habits. We reviewed your history of Helicobacter pylori infection and medication use. We have developed a plan to manage your symptoms and scheduled further testing.  YOUR PLAN:  GASTROESOPHAGEAL REFLUX DISEASE: You have chronic acid reflux that is worsened by certain foods, medications, and late meals. -Take pantoprazole  before breakfast and dinner. -Avoid NSAIDs like ibuprofen , naproxen, and meloxicam . Use acetaminophen  and muscle relaxants for pain instead. -Avoid sodas, artificial sweeteners, fruit juices, and energy drinks. Increase your intake of fruits and vegetables, and prefer whole fruit over juice. -Avoid eating late dinners and try to eat 3-4 hours before bedtime. -Drink water with lemon and limit coffee to 1-2 cups daily. -We have scheduled an upper endoscopy to check for gastritis, ulcers, and other issues. - Pantoprazole  40mg  twice daily -Be mindful of the impact of dietary sugars and late meals on your reflux symptoms.  HELICOBACTER PYLORI INFECTION: You had a previous Helicobacter pylori infection and need re-evaluation due to persistent symptoms. -We have ordered a stool antigen test for H. pylori. -Stay off acid suppressants until the stool test is completed. -We have scheduled an upper endoscopy to check for gastritis or ulcers.  IRRITABLE BOWEL SYNDROME: You have chronic bloating, abdominal discomfort, and alternating constipation and diarrhea. -Take dicyclomine  as needed every 8 hours for cramping, bloating, and fullness. -Avoid fiber supplements as they may cause bloating. -Take docusate sodium  nightly to help with bowel  movements. -We have ordered serologic testing for gluten sensitivity to check for dietary triggers. -Increase your water intake to 8 cups daily. -Avoid sodas, artificial sweeteners, fruit juices, and energy drinks. Increase your intake of fruits and vegetables, and prefer whole fruit over juice. -We have deferred the colonoscopy for now but will reconsider if your symptoms change.  You have been scheduled for an endoscopy. Please follow written instructions given to you at your visit today.  If you use inhalers (even only as needed), please bring them with you on the day of your procedure.  If you take any of the following medications, they will need to be adjusted prior to your procedure:   DO NOT TAKE 7 DAYS PRIOR TO TEST- Trulicity (dulaglutide) Ozempic, Wegovy (semaglutide) Mounjaro, Zepbound (tirzepatide) Bydureon Bcise (exanatide extended release)  DO NOT TAKE 1 DAY PRIOR TO YOUR TEST Rybelsus (semaglutide) Adlyxin (lixisenatide) Victoza (liraglutide) Byetta (exanatide) ___________________________________________________________________________    Due to recent changes in healthcare laws, you may see the results of your imaging and laboratory studies on MyChart before your provider has had a chance to review them.  We understand that in some cases there may be results that are confusing or concerning to you. Not all laboratory results come back in the same time frame and the provider may be waiting for multiple results in order to interpret others.  Please give us  48 hours in order for your provider to thoroughly review all the results before contacting the office for clarification of your results.    I appreciate the  opportunity to care for you  Thank You   Kavitha Nandigam , MD

## 2024-08-12 LAB — HELICOBACTER PYLORI  SPECIAL ANTIGEN
MICRO NUMBER:: 17468209
RESULT:: DETECTED — AB
SPECIMEN QUALITY: ADEQUATE

## 2024-08-13 ENCOUNTER — Other Ambulatory Visit: Payer: Self-pay

## 2024-08-13 ENCOUNTER — Encounter: Payer: Self-pay | Admitting: Gastroenterology

## 2024-08-13 ENCOUNTER — Ambulatory Visit: Payer: Self-pay | Admitting: Gastroenterology

## 2024-08-13 LAB — IGA: Immunoglobulin A: 145 mg/dL (ref 47–310)

## 2024-08-13 LAB — TISSUE TRANSGLUTAMINASE, IGA: (tTG) Ab, IgA: <1 U/mL

## 2024-08-13 MED ORDER — DOXYCYCLINE HYCLATE 100 MG PO CAPS: 100.0000 mg | ORAL_CAPSULE | Freq: Two times a day (BID) | ORAL | 0 refills | Status: AC

## 2024-08-13 MED ORDER — BISMUTH SUBSALICYLATE 262 MG PO TABS: 2.0000 | ORAL_TABLET | Freq: Four times a day (QID) | ORAL | 0 refills | Status: AC

## 2024-08-13 MED ORDER — METRONIDAZOLE 250 MG PO TABS: 250.0000 mg | ORAL_TABLET | Freq: Four times a day (QID) | ORAL | 0 refills | Status: AC

## 2024-08-16 ENCOUNTER — Encounter: Payer: Self-pay | Admitting: Gastroenterology

## 2024-08-17 NOTE — Telephone Encounter (Signed)
Please schedule office visit with APP. Thanks

## 2024-08-18 ENCOUNTER — Ambulatory Visit
Attending: Student in an Organized Health Care Education/Training Program | Admitting: Student in an Organized Health Care Education/Training Program

## 2024-08-18 ENCOUNTER — Encounter: Payer: Self-pay | Admitting: Student in an Organized Health Care Education/Training Program

## 2024-08-18 VITALS — BP 126/74 | HR 99 | Temp 98.4°F | Resp 16 | Ht 67.0 in | Wt 185.0 lb

## 2024-08-18 DIAGNOSIS — G894 Chronic pain syndrome: Secondary | ICD-10-CM | POA: Insufficient documentation

## 2024-08-18 DIAGNOSIS — G8929 Other chronic pain: Secondary | ICD-10-CM | POA: Insufficient documentation

## 2024-08-18 DIAGNOSIS — M5416 Radiculopathy, lumbar region: Secondary | ICD-10-CM | POA: Diagnosis present
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
## 2024-08-18 NOTE — Patient Instructions (Addendum)
 ______________________________________________________________________    General Risks and Possible Complications  Patient Responsibilities: It is important that you read this as it is part of your informed consent. It is our duty to inform you of the risks and possible complications associated with treatments offered to you. It is your responsibility as a patient to read this and to ask questions about anything that is not clear or that you believe was not covered in this document.  Patient's Rights: You have the right to refuse treatment. You also have the right to change your mind, even after initially having agreed to have the treatment done. However, under this last option, if you wait until the last second to change your mind, you may be charged for the materials used up to that point.  Introduction: Medicine is not an Visual merchandiser. Everything in Medicine, including the lack of treatment(s), carries the potential for danger, harm, or loss (which is by definition: Risk). In Medicine, a complication is a secondary problem, condition, or disease that can aggravate an already existing one. All treatments carry the risk of possible complications. The fact that a side effects or complications occurs, does not imply that the treatment was conducted incorrectly. It must be clearly understood that these can happen even when everything is done following the highest safety standards.  No treatment: You can choose not to proceed with the proposed treatment alternative. The "PRO(s)" would include: avoiding the risk of complications associated with the therapy. The "CON(s)" would include: not getting any of the treatment benefits. These benefits fall under one of three categories: diagnostic; therapeutic; and/or palliative. Diagnostic benefits include: getting information which can ultimately lead to improvement of the disease or symptom(s). Therapeutic benefits are those associated with the successful  treatment of the disease. Finally, palliative benefits are those related to the decrease of the primary symptoms, without necessarily curing the condition (example: decreasing the pain from a flare-up of a chronic condition, such as incurable terminal cancer).  General Risks and Complications: These are associated to most interventional treatments. They can occur alone, or in combination. They fall under one of the following six (6) categories: no benefit or worsening of symptoms; bleeding; infection; nerve damage; allergic reactions; and/or death. No benefits or worsening of symptoms: In Medicine there are no guarantees, only probabilities. No healthcare provider can ever guarantee that a medical treatment will work, they can only state the probability that it may. Furthermore, there is always the possibility that the condition may worsen, either directly, or indirectly, as a consequence of the treatment. Bleeding: This is more common if the patient is taking a blood thinner, either prescription or over the counter (example: Goody Powders, Fish oil, Aspirin, Garlic, etc.), or if suffering a condition associated with impaired coagulation (example: Hemophilia, cirrhosis of the liver, low platelet counts, etc.). However, even if you do not have one on these, it can still happen. If you have any of these conditions, or take one of these drugs, make sure to notify your treating physician. Infection: This is more common in patients with a compromised immune system, either due to disease (example: diabetes, cancer, human immunodeficiency virus [HIV], etc.), or due to medications or treatments (example: therapies used to treat cancer and rheumatological diseases). However, even if you do not have one on these, it can still happen. If you have any of these conditions, or take one of these drugs, make sure to notify your treating physician. Nerve Damage: This is more common when the treatment is  an invasive one, but it  can also happen with the use of medications, such as those used in the treatment of cancer. The damage can occur to small secondary nerves, or to large primary ones, such as those in the spinal cord and brain. This damage may be temporary or permanent and it may lead to impairments that can range from temporary numbness to permanent paralysis and/or brain death. Allergic Reactions: Any time a substance or material comes in contact with our body, there is the possibility of an allergic reaction. These can range from a mild skin rash (contact dermatitis) to a severe systemic reaction (anaphylactic reaction), which can result in death. Death: In general, any medical intervention can result in death, most of the time due to an unforeseen complication. ______________________________________________________________________      ______________________________________________________________________    Preparing for your procedure  Appointments: If you think you may not be able to keep your appointment, call 24-48 hours in advance to cancel. We need time to make it available to others.  Procedure visits are for procedures only. During your procedure appointment there will be: NO Prescription Refills*. NO medication changes or discussions*. NO discussion of disability issues*. NO unrelated pain problem evaluations*. NO evaluations to order other pain procedures*. *These will be addressed at a separate and distinct evaluation encounter on the provider's evaluation schedule and not during procedure days.  Instructions: Food intake: Avoid eating anything solid for at least 8 hours prior to your procedure. Clear liquid intake: You may take clear liquids such as water up to 2 hours prior to your procedure. (No carbonated drinks. No soda.) Transportation: Unless otherwise stated by your physician, bring a driver. (Driver cannot be a Market researcher, Pharmacist, community, or any other form of public transportation.) Morning  Medicines: Except for blood thinners, take all of your other morning medications with a sip of water. Make sure to take your heart and blood pressure medicines. If your blood pressure's lower number is above 100, the case will be rescheduled. Blood thinners: Make sure to stop your blood thinners as instructed.  If you take a blood thinner, but were not instructed to stop it, call our office [phone] and ask to talk to a nurse. Not stopping a blood thinner prior to certain procedures could lead to serious complications. Diabetics on insulin: Notify the staff so that you can be scheduled 1st case in the morning. If your diabetes requires high dose insulin, take only  of your normal insulin dose the morning of the procedure and notify the staff that you have done so. Preventing infections: Shower with an antibacterial soap the morning of your procedure.  Build-up your immune system: Take 1000 mg of Vitamin C with every meal (3 times a day) the day prior to your procedure. Antibiotics: Inform the nursing staff if you are taking any antibiotics or if you have any conditions that may require antibiotics prior to procedures. (Example: recent joint implants)   Pregnancy: If you are pregnant make sure to notify the nursing staff. Not doing so may result in injury to the fetus, including death.  Sickness: If you have a cold, fever, or any active infections, call and cancel or reschedule your procedure. Receiving steroids while having an infection may result in complications. Arrival: You must be in the facility at least 30 minutes prior to your scheduled procedure. Tardiness: Your scheduled time is also the cutoff time. If you do not arrive at least 15 minutes prior to your procedure, you will  be rescheduled.  Children: Do not bring any children with you. Make arrangements to keep them home. Dress appropriately: There is always a possibility that your clothing may get soiled. Avoid long dresses. Valuables:  Do not bring any jewelry or valuables.  Reasons to call and reschedule or cancel your procedure: (Following these recommendations will minimize the risk of a serious complication.) Surgeries: Avoid having procedures within 2 weeks of any surgery. (Avoid for 2 weeks before or after any surgery). Flu Shots: Avoid having procedures within 2 weeks of a flu shots or . (Avoid for 2 weeks before or after immunizations). Barium: Avoid having a procedure within 7-10 days after having had a radiological study involving the use of radiological contrast. (Myelograms, Barium swallow or enema study). Heart attacks: Avoid any elective procedures or surgeries for the initial 6 months after a "Myocardial Infarction" (Heart Attack). Blood thinners: It is imperative that you stop these medications before procedures. Let us know if you if you take any blood thinner.  Infection: Avoid procedures during or within two weeks of an infection (including chest colds or gastrointestinal problems). Symptoms associated with infections include: Localized redness, fever, chills, night sweats or profuse sweating, burning sensation when voiding, cough, congestion, stuffiness, runny nose, sore throat, diarrhea, nausea, vomiting, cold or Flu symptoms, recent or current infections. It is specially important if the infection is over the area that we intend to treat. Heart and lung problems: Symptoms that may suggest an active cardiopulmonary problem include: cough, chest pain, breathing difficulties or shortness of breath, dizziness, ankle swelling, uncontrolled high or unusually low blood pressure, and/or palpitations. If you are experiencing any of these symptoms, cancel your procedure and contact your primary care physician for an evaluation.  Remember:  Regular Business hours are:  Monday to Thursday 8:00 AM to 4:00 PM  Provider's Schedule: Delano Metz, MD:  Procedure days: Tuesday and Thursday 7:30 AM to 4:00 PM  Edward Jolly, MD:  Procedure days: Monday and Wednesday 7:30 AM to 4:00 PM Last  Updated: 07/08/2023 ______________________________________________________________________     Epidural Steroid Injection Patient Information  Description: The epidural space surrounds the nerves as they exit the spinal cord.  In some patients, the nerves can be compressed and inflamed by a bulging disc or a tight spinal canal (spinal stenosis).  By injecting steroids into the epidural space, we can bring irritated nerves into direct contact with a potentially helpful medication.  These steroids act directly on the irritated nerves and can reduce swelling and inflammation which often leads to decreased pain.  Epidural steroids may be injected anywhere along the spine and from the neck to the low back depending upon the location of your pain.   After numbing the skin with local anesthetic (like Novocaine), a small needle is passed into the epidural space slowly.  You may experience a sensation of pressure while this is being done.  The entire block usually last less than 10 minutes.  Conditions which may be treated by epidural steroids:  Low back and leg pain Neck and arm pain Spinal stenosis Post-laminectomy syndrome Herpes zoster (shingles) pain Pain from compression fractures  Preparation for the injection:  Do not eat any solid food or dairy products within 8 hours of your appointment.  You may drink clear liquids up to 3 hours before appointment.  Clear liquids include water, black coffee, juice or soda.  No milk or cream please. You may take your regular medication, including pain medications, with a sip of water  before your appointment  Diabetics should hold regular insulin (if taken separately) and take 1/2 normal NPH dos the morning of the procedure.  Carry some sugar containing items with you to your appointment. A driver must accompany you and be prepared to drive you home after your procedure.  Bring all  your current medications with your. An IV may be inserted and sedation may be given at the discretion of the physician.   A blood pressure cuff, EKG and other monitors will often be applied during the procedure.  Some patients may need to have extra oxygen administered for a short period. You will be asked to provide medical information, including your allergies, prior to the procedure.  We must know immediately if you are taking blood thinners (like Coumadin/Warfarin)  Or if you are allergic to IV iodine contrast (dye). We must know if you could possible be pregnant.  Possible side-effects: Bleeding from needle site Infection (rare, may require surgery) Nerve injury (rare) Numbness & tingling (temporary) Difficulty urinating (rare, temporary) Spinal headache ( a headache worse with upright posture) Light -headedness (temporary) Pain at injection site (several days) Decreased blood pressure (temporary) Weakness in arm/leg (temporary) Pressure sensation in back/neck (temporary)  Call if you experience: Fever/chills associated with headache or increased back/neck pain. Headache worsened by an upright position. New onset weakness or numbness of an extremity below the injection site Hives or difficulty breathing (go to the emergency room) Inflammation or drainage at the infection site Severe back/neck pain Any new symptoms which are concerning to you  Please note:  Although the local anesthetic injected can often make your back or neck feel good for several hours after the injection, the pain will likely return.  It takes 3-7 days for steroids to work in the epidural space.  You may not notice any pain relief for at least that one week.  If effective, we will often do a series of three injections spaced 3-6 weeks apart to maximally decrease your pain.  After the initial series, we generally will wait several months before considering a repeat injection of the same type.  If you have any  questions, please call [phone] [facility]

## 2024-08-18 NOTE — Progress Notes (Signed)
 Safety precautions to be maintained throughout the outpatient stay will include: orient to surroundings, keep bed in low position, maintain call bell within reach at all times, provide assistance with transfer out of bed and ambulation.

## 2024-08-23 ENCOUNTER — Ambulatory Visit: Admitting: Student in an Organized Health Care Education/Training Program

## 2024-08-24 ENCOUNTER — Ambulatory Visit: Admitting: Physician Assistant

## 2024-09-03 ENCOUNTER — Ambulatory Visit: Admitting: Gastroenterology

## 2024-09-03 ENCOUNTER — Encounter: Payer: Self-pay | Admitting: Gastroenterology

## 2024-09-03 VITALS — BP 116/57 | HR 80 | Temp 98.1°F | Resp 19 | Ht 67.0 in | Wt 185.0 lb

## 2024-09-03 DIAGNOSIS — K219 Gastro-esophageal reflux disease without esophagitis: Secondary | ICD-10-CM

## 2024-09-03 DIAGNOSIS — K2990 Gastroduodenitis, unspecified, without bleeding: Secondary | ICD-10-CM

## 2024-09-03 DIAGNOSIS — R1310 Dysphagia, unspecified: Secondary | ICD-10-CM

## 2024-09-03 MED ORDER — SODIUM CHLORIDE 0.9 % IV SOLN: 500.0000 mL | Freq: Once | INTRAVENOUS | Status: DC

## 2024-09-03 NOTE — Progress Notes (Signed)
 Called to room to assist during endoscopic procedure.  Patient ID and intended procedure confirmed with present staff. Received instructions for my participation in the procedure from the performing physician.

## 2024-09-03 NOTE — Patient Instructions (Addendum)
 Resume previous diet Continue present medications Await pathology results Appointment with Delon Failing on Monday 3/16 at 920 AM, please arrive at 905 AM  Handouts/information given for gastritis  YOU HAD AN ENDOSCOPIC PROCEDURE TODAY AT THE [facility]:   Refer to the procedure report that was given to you for any specific questions about what was found during the examination.  If the procedure report does not answer your questions, please call your gastroenterologist to clarify.  If you requested that your care partner not be given the details of your procedure findings, then the procedure report has been included in a sealed envelope for you to review at your convenience later.  YOU SHOULD EXPECT: Some feelings of bloating in the abdomen. Passage of more gas than usual.  Walking can help get rid of the air that was put into your GI tract during the procedure and reduce the bloating. If you had a lower endoscopy (such as a colonoscopy or flexible sigmoidoscopy) you may notice spotting of blood in your stool or on the toilet paper. If you underwent a bowel prep for your procedure, you may not have a normal bowel movement for a few days.  Please Note:  You might notice some irritation and congestion in your nose or some drainage.  This is from the oxygen used during your procedure.  There is no need for concern and it should clear up in a day or so.  SYMPTOMS TO REPORT IMMEDIATELY:  Following upper endoscopy (EGD)  Vomiting of blood or coffee ground material  New chest pain or pain under the shoulder blades  Painful or persistently difficult swallowing  New shortness of breath  Fever of 100F or higher  Black, tarry-looking stools  For urgent or emergent issues, a gastroenterologist can be reached at any hour by calling [phone]. Do not use MyChart messaging for urgent concerns.   DIET:  We do recommend a small meal at first, but then you may proceed to your regular  diet.  Drink plenty of fluids but you should avoid alcoholic beverages for 24 hours.  ACTIVITY:  You should plan to take it easy for the rest of today and you should NOT DRIVE or use heavy machinery until tomorrow (because of the sedation medicines used during the test).    FOLLOW UP: Our staff will call the number listed on your records the next business day following your procedure.  We will call around 7:15- 8:00 am to check on you and address any questions or concerns that you may have regarding the information given to you following your procedure. If we do not reach you, we will leave a message.     If any biopsies were taken you will be contacted by phone or by letter within the next 1-3 weeks.  Please call us  at [phone] if you have not heard about the biopsies in 3 weeks.   SIGNATURES/CONFIDENTIALITY: You and/or your care partner have signed paperwork which will be entered into your electronic medical record.  These signatures attest to the fact that that the information above on your After Visit Summary has been reviewed and is understood.  Full responsibility of the confidentiality of this discharge information lies with you and/or your care-partner.

## 2024-09-03 NOTE — Progress Notes (Signed)
 [facility] History and Physical   Primary Care Physician:  Alvia Selinda PARAS, MD   Reason for Procedure:  GERD, dysphagia, epigastric pain  Plan:    EGD  with possible interventions as needed     HPI: Katrina Ortega is a very pleasant 29 y.o. female here for EGD for GERD, dysphagia, epigastric pain.  Please refer to office visit note 08/11/24 for additional details  The risks and benefits as well as alternatives of endoscopic procedure(s) have been discussed and reviewed.  The patient was provided an opportunity to ask questions and all were answered. The patient agreed with the plan and demonstrated an understanding of the instructions.   Past Medical History:  Diagnosis Date   Allergy    Anemia    Anxiety and depression    GERD (gastroesophageal reflux disease)    Group beta Strep positive 08/24/2018   Identified in urine 01/22 Will require prophylaxis in labor   Hypertension    preeclampsia   Medical history non-contributory    Mental disorder    Supervision of other normal pregnancy, antepartum 08/19/2018    Nursing Staff Provider Office Location  [place] Dating   LMP Language   ENG Anatomy US   Normal Flu Vaccine  Declined Genetic Screen  Declined  TDaP vaccine   11/04/2018 Hgb A1C or  GTT Early  Third trimester  Rhogam   N/A: A POS   LAB RESULTS  Feeding Plan  breast Blood Type A/Positive/-- (01/22 1555)  Contraception  Depo Antibody Negative (01/22 1555) Circumcision Yes Rubella 3.68 (01/22 1555) Pediatri    Past Surgical History:  Procedure Laterality Date   CESAREAN SECTION N/A 01/22/2019   Procedure: CESAREAN SECTION;  Surgeon: Alger Gong, MD;  Location: [facility] LD ORS;  Service: Obstetrics;  Laterality: N/A;   TOENAIL EXCISION Bilateral 2021   WISDOM TOOTH EXTRACTION      Prior to Admission medications  Medication Sig Start Date End Date Taking? Authorizing Provider  amoxicillin  (AMOXIL ) 250 MG capsule Take 250 mg by mouth 2 (two) times daily.   Yes  [provider]  bismuth  subsalicylate (PEPTO BISMOL) 262 MG chewable tablet Chew 262 mg by mouth as needed.   Yes [provider]  cariprazine  (VRAYLAR ) 3 MG capsule Take 1 capsule (3 mg total) by mouth daily. 08/09/24  Yes Alvia Selinda PARAS, MD  cyclobenzaprine  (FEXMID ) 7.5 MG tablet Take 7.5 mg by mouth 3 (three) times daily as needed for muscle spasms.   Yes [provider]  doxycycline  (ADOXA) 50 MG tablet Take 50 mg by mouth 2 (two) times daily.   Yes [provider]  gabapentin  (NEURONTIN ) 100 MG capsule Take 1 capsule (100 mg total) by mouth at bedtime as needed. 07/27/24  Yes Matthews, Jason J, MD  hydrocortisone  (ANUSOL -HC) 2.5 % rectal cream Place 1 Application rectally 2 (two) times daily. 05/30/23  Yes [place], PA-C  metroNIDAZOLE  (FLAGYL ) 500 MG tablet Take 500 mg by mouth 2 (two) times daily. 04/13/24  Yes [provider]  omeprazole  (PRILOSEC) 20 MG capsule Take 20 mg by mouth 2 (two) times daily before a meal.   Yes [provider]  tiZANidine  (ZANAFLEX ) 4 MG tablet Take 1 tablet (4 mg total) by mouth every 8 (eight) hours as needed for muscle spasms. 07/27/24  Yes Alvia Selinda PARAS, MD  amitriptyline  (ELAVIL ) 25 MG tablet TAKE 1 TABLET(25 MG) BY MOUTH AT BEDTIME 08/25/23   Matthews, Jason J, MD  azelastine (ASTELIN) 0.1 % nasal spray Place  1 spray into both nostrils 2 (two) times daily.    [provider]  cetirizine  (ZYRTEC  ALLERGY) 10 MG tablet Take 1 tablet (10 mg total) by mouth daily. 05/26/19   Gene Lucie BROCKS, CNM  fluconazole  (DIFLUCAN ) 150 MG tablet Take 150 mg by mouth once. 06/29/24   [provider]  omeprazole  (PRILOSEC) 40 MG capsule Take 1 capsule (40 mg total) by mouth 2 (two) times daily. 05/30/23 08/18/24  [place], PA-C  omeprazole  (PRILOSEC) 40 MG capsule Take 40 mg by mouth 2 (two) times daily.    [provider]  pantoprazole  (PROTONIX ) 40 MG tablet Take 1 tablet (40 mg  total) by mouth 2 (two) times daily before a meal. Patient not taking: Reported on 08/18/2024 08/11/24   Sonita Michiels V, MD  traZODone  (DESYREL ) 50 MG tablet Take 0.5-2 tablets (25-100 mg total) by mouth at bedtime as needed. 01/16/24   Matthews, Jason J, MD    Current Outpatient Medications  Medication Sig Dispense Refill   amoxicillin  (AMOXIL ) 250 MG capsule Take 250 mg by mouth 2 (two) times daily.     bismuth  subsalicylate (PEPTO BISMOL) 262 MG chewable tablet Chew 262 mg by mouth as needed.     cariprazine  (VRAYLAR ) 3 MG capsule Take 1 capsule (3 mg total) by mouth daily. 90 capsule 0   cyclobenzaprine  (FEXMID ) 7.5 MG tablet Take 7.5 mg by mouth 3 (three) times daily as needed for muscle spasms.     doxycycline  (ADOXA) 50 MG tablet Take 50 mg by mouth 2 (two) times daily.     gabapentin  (NEURONTIN ) 100 MG capsule Take 1 capsule (100 mg total) by mouth at bedtime as needed. 30 capsule 1   hydrocortisone  (ANUSOL -HC) 2.5 % rectal cream Place 1 Application rectally 2 (two) times daily. 30 g 1   metroNIDAZOLE  (FLAGYL ) 500 MG tablet Take 500 mg by mouth 2 (two) times daily.     omeprazole  (PRILOSEC) 20 MG capsule Take 20 mg by mouth 2 (two) times daily before a meal.     tiZANidine  (ZANAFLEX ) 4 MG tablet Take 1 tablet (4 mg total) by mouth every 8 (eight) hours as needed for muscle spasms. 30 tablet 1   amitriptyline  (ELAVIL ) 25 MG tablet TAKE 1 TABLET(25 MG) BY MOUTH AT BEDTIME 90 tablet 0   azelastine (ASTELIN) 0.1 % nasal spray Place 1 spray into both nostrils 2 (two) times daily.     cetirizine  (ZYRTEC  ALLERGY) 10 MG tablet Take 1 tablet (10 mg total) by mouth daily. 30 tablet 2   fluconazole  (DIFLUCAN ) 150 MG tablet Take 150 mg by mouth once.     omeprazole  (PRILOSEC) 40 MG capsule Take 1 capsule (40 mg total) by mouth 2 (two) times daily. 60 capsule 2   omeprazole  (PRILOSEC) 40 MG capsule Take 40 mg by mouth 2 (two) times daily.     pantoprazole  (PROTONIX ) 40 MG tablet Take 1 tablet (40  mg total) by mouth 2 (two) times daily before a meal. (Patient not taking: Reported on 08/18/2024) 180 tablet 3   traZODone  (DESYREL ) 50 MG tablet Take 0.5-2 tablets (25-100 mg total) by mouth at bedtime as needed. 60 tablet 1   Current Facility-Administered Medications  Medication Dose Route Frequency Provider Last Rate Last Admin   0.9 %  sodium chloride  infusion  500 mL Intravenous Once Keiasia Christianson V, MD        Allergies as of 09/03/2024 - Review Complete 09/03/2024  Allergen Reaction Noted   Diphenhydramine -acetaminophen  Other (See Comments)  08/26/2023   Percocet [oxycodone -acetaminophen ] Itching 09/28/2016    Family History  Problem Relation Age of Onset   Multiple sclerosis Mother    Migraines Mother    Hypertension Father    Migraines Father    Other Father        tumor in front of brain   Diabetes Paternal Uncle    Schizophrenia Maternal Grandmother    Diabetes Maternal Grandfather    Diabetes Paternal Grandmother    Hypertension Paternal Grandmother    Eczema Son    Breast cancer Maternal Great-grandmother        mid to late years    Social History   Socioeconomic History   Marital status: Single    Spouse name: Not on file   Number of children: 1   Years of education: 12+   Highest education level: Some college, no degree  Occupational History   Not on file  Tobacco Use   Smoking status: Never   Smokeless tobacco: Never  Vaping Use   Vaping status: Every Day   Substances: Nicotine, THC, Flavoring  Substance and Sexual Activity   Alcohol use: Yes    Alcohol/week: 6.0 standard drinks of alcohol    Types: 6 Shots of liquor per week    Comment: weekends socially   Drug use: Yes    Frequency: 2.0 times per week    Types: Marijuana   Sexual activity: Yes    Partners: Male    Birth control/protection: Injection  Other Topics Concern   Not on file  Social History Narrative   Not on file   Social Drivers of Health   Tobacco Use: Low Risk  (09/03/2024)   Patient History    Smoking Tobacco Use: Never    Smokeless Tobacco Use: Never    Passive Exposure: Not on file  Recent Concern: Tobacco Use - High Risk (09/03/2024)   Patient History    Smoking Tobacco Use: Every Day    Smokeless Tobacco Use: Never    Passive Exposure: Not on file  Financial Resource Strain: Low Risk (09/06/2022)   Overall Financial Resource Strain (CARDIA)    Difficulty of Paying Living Expenses: Not hard at all  Food Insecurity: No Food Insecurity (09/06/2022)   Hunger Vital Sign    Worried About Running Out of Food in the Last Year: Never true    Ran Out of Food in the Last Year: Never true  Transportation Needs: No Transportation Needs (09/06/2022)   PRAPARE - Administrator, Civil Service (Medical): No    Lack of Transportation (Non-Medical): No  Physical Activity: Not on file  Stress: Not on file  Social Connections: Not on file  Intimate Partner Violence: Not At Risk (09/06/2022)   Humiliation, Afraid, Rape, and Kick questionnaire    Fear of Current or Ex-Partner: No    Emotionally Abused: No    Physically Abused: No    Sexually Abused: No  Depression (PHQ2-9): Low Risk (08/18/2024)   Depression (PHQ2-9)    PHQ-2 Score: 0  Recent Concern: Depression (PHQ2-9) - Medium Risk (06/15/2024)   Depression (PHQ2-9)    PHQ-2 Score: 9  Alcohol Screen: Low Risk (09/11/2021)   Alcohol Screen    Last Alcohol Screening Score (AUDIT): 4  Housing: Low Risk (09/06/2022)   Housing    Last Housing Risk Score: 0  Utilities: Not At Risk (09/06/2022)   AHC Utilities    Threatened with loss of utilities: No  Health Literacy: Not on file  Review of Systems:  All other review of systems negative except as mentioned in the HPI.  Physical Exam: Vital signs in last 24 hours: BP 130/76   Pulse 88   Temp 98.1 F (36.7 C)   Ht 5' 7 (1.702 m)   Wt 185 lb (83.9 kg)   LMP  (LMP Unknown)   SpO2 99%   BMI 28.98 kg/m  General:   Alert, NAD Lungs:  Clear .    Heart:  Regular rate and rhythm Abdomen:  Soft, nontender and nondistended. Neuro/Psych:  Alert and cooperative. Normal mood and affect. A and O x 3  Reviewed labs, radiology imaging, old records and pertinent past GI work up  Patient is appropriate for planned procedure(s) and anesthesia in an ambulatory setting   K. Veena Rushil Kimbrell , MD [phone]

## 2024-09-03 NOTE — Op Note (Signed)
 [facility] Patient Name: Katrina Ortega Procedure Date: 09/03/2024 10:24 AM MRN: [id number] Endoscopist: Gustav ALONSO Mcgee , MD, [id number] Age: 29 Referring MD:  Date of Birth: 1996/06/14 Gender: Female Account #: [id number] Procedure:                Upper GI endoscopy Indications:              Dysphagia, Epigastric abdominal pain, Esophageal                            reflux symptoms that persist despite appropriate                            therapy Medicines:                Monitored Anesthesia Care Procedure:                Pre-Anesthesia Assessment:                           - Prior to the procedure, a History and Physical                            was performed, and patient medications and                            allergies were reviewed. The patient's tolerance of                            previous anesthesia was also reviewed. The risks                            and benefits of the procedure and the sedation                            options and risks were discussed with the patient.                            All questions were answered, and informed consent                            was obtained. Prior Anticoagulants: The patient has                            taken no anticoagulant or antiplatelet agents. ASA                            Grade Assessment: II - A patient with mild systemic                            disease. After reviewing the risks and benefits,                            the patient was deemed in satisfactory condition to  undergo the procedure.                           After obtaining informed consent, the endoscope was                            passed under direct vision. Throughout the                            procedure, the patient's blood pressure, pulse, and                            oxygen saturations were monitored continuously. The                            GIF [id number] #[id number] was introduced through  the                            mouth, and advanced to the second part of duodenum.                            The upper GI endoscopy was accomplished without                            difficulty. The patient tolerated the procedure                            well. Scope In: Scope Out: Findings:                 The Z-line was regular and was found 36 cm from the                            incisors.                           No endoscopic abnormality was evident in the                            esophagus to explain the patient's complaint of                            dysphagia. It was decided, however, to proceed with                            dilation of the entire esophagus. The scope was                            withdrawn. Dilation was performed with a Maloney                            dilator with no resistance at 54 Fr. The dilation                            site was examined following  endoscope reinsertion                            and showed no change.                           Patchy minimal inflammation characterized by                            congestion (edema), erythema and friability was                            found in the entire examined stomach. Biopsies were                            taken with a cold forceps for Helicobacter pylori                            testing.                           The cardia and gastric fundus were normal on                            retroflexion.                           The examined duodenum was normal. Complications:            No immediate complications. Estimated Blood Loss:     Estimated blood loss was minimal. Impression:               - Z-line regular, 36 cm from the incisors.                           - No endoscopic esophageal abnormality to explain                            patient's dysphagia. Esophagus dilated. Dilated.                           - Gastritis. Biopsied.                           - Normal examined  duodenum. Recommendation:           - Resume previous diet.                           - Continue present medications.                           - Await pathology results.                           - Follow up with [facility] in 1-2 months, next  available appointment Gustav ALONSO Mcgee, MD 09/03/2024 10:48:52 AM This report has been signed electronically.

## 2024-09-03 NOTE — Progress Notes (Signed)
 Pt's states no medical or surgical changes since previsit or office visit.

## 2024-09-03 NOTE — Progress Notes (Signed)
 Transferred to PACU via stretcher.  Not responding to stimulation at this time.  VSS upon leaving procedure room.

## 2024-09-06 ENCOUNTER — Ambulatory Visit: Admitting: Student in an Organized Health Care Education/Training Program

## 2024-09-06 ENCOUNTER — Ambulatory Visit

## 2024-09-10 ENCOUNTER — Ambulatory Visit: Admitting: Family Medicine

## 2024-10-11 ENCOUNTER — Ambulatory Visit: Admitting: Physician Assistant
